# Patient Record
Sex: Male | Born: 1940 | Race: White | Hispanic: No | Marital: Married | Smoking: Never smoker
Health system: Southern US, Community
[De-identification: ages and names within clinical notes are randomized; demographics above are authoritative.]

## PROBLEM LIST (undated history)

## (undated) DIAGNOSIS — F0390 Unspecified dementia without behavioral disturbance: Secondary | ICD-10-CM

## (undated) DIAGNOSIS — E119 Type 2 diabetes mellitus without complications: Secondary | ICD-10-CM

## (undated) DIAGNOSIS — I2510 Atherosclerotic heart disease of native coronary artery without angina pectoris: Secondary | ICD-10-CM

## (undated) DIAGNOSIS — Q210 Ventricular septal defect: Secondary | ICD-10-CM

## (undated) DIAGNOSIS — E785 Hyperlipidemia, unspecified: Secondary | ICD-10-CM

## (undated) DIAGNOSIS — I4891 Unspecified atrial fibrillation: Secondary | ICD-10-CM

## (undated) DIAGNOSIS — I10 Essential (primary) hypertension: Secondary | ICD-10-CM

## (undated) DIAGNOSIS — Z5189 Encounter for other specified aftercare: Secondary | ICD-10-CM

## (undated) DIAGNOSIS — Z8613 Personal history of malaria: Secondary | ICD-10-CM

## (undated) DIAGNOSIS — K5730 Diverticulosis of large intestine without perforation or abscess without bleeding: Secondary | ICD-10-CM

## (undated) DIAGNOSIS — D649 Anemia, unspecified: Secondary | ICD-10-CM

## (undated) DIAGNOSIS — K760 Fatty (change of) liver, not elsewhere classified: Secondary | ICD-10-CM

## (undated) DIAGNOSIS — I714 Abdominal aortic aneurysm, without rupture, unspecified: Secondary | ICD-10-CM

## (undated) DIAGNOSIS — K219 Gastro-esophageal reflux disease without esophagitis: Secondary | ICD-10-CM

## (undated) HISTORY — DX: Anemia, unspecified: D64.9

## (undated) HISTORY — DX: Unspecified dementia, unspecified severity, without behavioral disturbance, psychotic disturbance, mood disturbance, and anxiety: F03.90

## (undated) HISTORY — DX: Personal history of malaria: Z86.13

## (undated) HISTORY — PX: VSD REPAIR: SHX276

## (undated) HISTORY — DX: Gastro-esophageal reflux disease without esophagitis: K21.9

## (undated) HISTORY — DX: Unspecified atrial fibrillation: I48.91

## (undated) HISTORY — DX: Hyperlipidemia, unspecified: E78.5

## (undated) HISTORY — DX: Encounter for other specified aftercare: Z51.89

## (undated) HISTORY — DX: Fatty (change of) liver, not elsewhere classified: K76.0

## (undated) HISTORY — PX: PTCA: SHX146

## (undated) HISTORY — DX: Ventricular septal defect: Q21.0

## (undated) HISTORY — DX: Atherosclerotic heart disease of native coronary artery without angina pectoris: I25.10

## (undated) HISTORY — DX: Diverticulosis of large intestine without perforation or abscess without bleeding: K57.30

## (undated) HISTORY — DX: Type 2 diabetes mellitus without complications: E11.9

## (undated) HISTORY — DX: Essential (primary) hypertension: I10

## (undated) HISTORY — DX: Abdominal aortic aneurysm, without rupture, unspecified: I71.40

---

## 1972-05-21 HISTORY — PX: VSD REPAIR: SHX276

## 1991-05-22 HISTORY — PX: CORONARY ARTERY BYPASS GRAFT: SHX141

## 2003-05-22 HISTORY — PX: ILIAC ARTERY STENT: SHX1786

## 2008-05-21 HISTORY — PX: CORONARY ANGIOGRAM: SHX5786

## 2011-05-22 HISTORY — PX: ATRIAL FIBRILLATION ABLATION: SHX5732

## 2011-05-22 HISTORY — PX: CORONARY ANGIOGRAM: SHX5786

## 2011-05-22 HISTORY — PX: PACEMAKER INSERTION: SHX728

## 2013-05-21 HISTORY — PX: PTCA: SHX146

## 2015-05-22 HISTORY — PX: LOWER EXTREMITY ANGIOGRAM: SHX5955

## 2018-01-10 LAB — LAB REPORT - SCANNED: EGFR: 73

## 2018-11-13 LAB — LAB REPORT - SCANNED: EGFR: 68

## 2021-08-28 LAB — CBC: RBC: 3.81 — AB (ref 3.87–5.11)

## 2021-08-28 LAB — HM HEPATITIS C SCREENING LAB: HM Hepatitis Screen: NEGATIVE

## 2021-08-28 LAB — TSH: TSH: 2.52 (ref 0.41–5.90)

## 2021-08-28 LAB — CBC AND DIFFERENTIAL
HCT: 35 — AB (ref 41–53)
Hemoglobin: 11.3 — AB (ref 13.5–17.5)
Neutrophils Absolute: 927
Platelets: 198 10*3/uL (ref 150–400)
WBC: 2.3

## 2021-08-28 LAB — BASIC METABOLIC PANEL WITH GFR
BUN: 10 (ref 4–21)
CO2: 25 — AB (ref 13–22)
Chloride: 107 (ref 99–108)
Creatinine: 0.8 (ref 0.6–1.3)
Glucose: 101
Potassium: 3.5 meq/L (ref 3.5–5.1)
Sodium: 142 (ref 137–147)

## 2021-08-28 LAB — COMPREHENSIVE METABOLIC PANEL WITH GFR
Albumin: 3.8 (ref 3.5–5.0)
Calcium: 8.9 (ref 8.7–10.7)
Globulin: 3.8
eGFR: 88

## 2021-08-28 LAB — HEPATIC FUNCTION PANEL
ALT: 10 U/L (ref 10–40)
AST: 25 (ref 14–40)
Alkaline Phosphatase: 66 (ref 25–125)
Bilirubin, Total: 0.6

## 2021-08-29 LAB — LIPID PANEL
Cholesterol: 159 (ref 0–200)
HDL: 51 (ref 35–70)
LDL Cholesterol: 86
Triglycerides: 123 (ref 40–160)

## 2021-09-28 ENCOUNTER — Emergency Department (HOSPITAL_COMMUNITY): Payer: Medicare HMO

## 2021-09-28 ENCOUNTER — Encounter (HOSPITAL_COMMUNITY): Payer: Self-pay | Admitting: Emergency Medicine

## 2021-09-28 ENCOUNTER — Emergency Department (HOSPITAL_COMMUNITY)
Admission: EM | Admit: 2021-09-28 | Discharge: 2021-09-28 | Disposition: A | Discharge status: Home / Self Care | Payer: Medicare HMO | Attending: Emergency Medicine | Admitting: Emergency Medicine

## 2021-09-28 ENCOUNTER — Other Ambulatory Visit: Payer: Self-pay

## 2021-09-28 DIAGNOSIS — Z7901 Long term (current) use of anticoagulants: Secondary | ICD-10-CM | POA: Diagnosis not present

## 2021-09-28 DIAGNOSIS — W1830XA Fall on same level, unspecified, initial encounter: Secondary | ICD-10-CM | POA: Diagnosis not present

## 2021-09-28 DIAGNOSIS — E876 Hypokalemia: Secondary | ICD-10-CM

## 2021-09-28 DIAGNOSIS — R1110 Vomiting, unspecified: Secondary | ICD-10-CM | POA: Insufficient documentation

## 2021-09-28 DIAGNOSIS — S51012A Laceration without foreign body of left elbow, initial encounter: Secondary | ICD-10-CM | POA: Insufficient documentation

## 2021-09-28 DIAGNOSIS — Y908 Blood alcohol level of 240 mg/100 ml or more: Secondary | ICD-10-CM | POA: Insufficient documentation

## 2021-09-28 DIAGNOSIS — W19XXXA Unspecified fall, initial encounter: Secondary | ICD-10-CM

## 2021-09-28 DIAGNOSIS — Z20822 Contact with and (suspected) exposure to covid-19: Secondary | ICD-10-CM | POA: Diagnosis not present

## 2021-09-28 DIAGNOSIS — Y92009 Unspecified place in unspecified non-institutional (private) residence as the place of occurrence of the external cause: Secondary | ICD-10-CM | POA: Insufficient documentation

## 2021-09-28 DIAGNOSIS — R4182 Altered mental status, unspecified: Secondary | ICD-10-CM | POA: Insufficient documentation

## 2021-09-28 DIAGNOSIS — R531 Weakness: Secondary | ICD-10-CM | POA: Insufficient documentation

## 2021-09-28 DIAGNOSIS — F1010 Alcohol abuse, uncomplicated: Secondary | ICD-10-CM

## 2021-09-28 DIAGNOSIS — S59902A Unspecified injury of left elbow, initial encounter: Secondary | ICD-10-CM | POA: Diagnosis present

## 2021-09-28 DIAGNOSIS — R600 Localized edema: Secondary | ICD-10-CM | POA: Diagnosis not present

## 2021-09-28 DIAGNOSIS — S51011A Laceration without foreign body of right elbow, initial encounter: Secondary | ICD-10-CM | POA: Diagnosis not present

## 2021-09-28 LAB — URINALYSIS, ROUTINE W REFLEX MICROSCOPIC
Bacteria, UA: NONE SEEN
Bilirubin Urine: NEGATIVE
Glucose, UA: NEGATIVE mg/dL
Ketones, ur: NEGATIVE mg/dL
Leukocytes,Ua: NEGATIVE
Nitrite: NEGATIVE
Protein, ur: NEGATIVE mg/dL
Specific Gravity, Urine: 1.009 (ref 1.005–1.030)
pH: 6 (ref 5.0–8.0)

## 2021-09-28 LAB — CBC WITH DIFFERENTIAL/PLATELET
Abs Immature Granulocytes: 0.18 10*3/uL — ABNORMAL HIGH (ref 0.00–0.07)
Basophils Absolute: 0 10*3/uL (ref 0.0–0.1)
Basophils Relative: 1 %
Eosinophils Absolute: 0 10*3/uL (ref 0.0–0.5)
Eosinophils Relative: 0 %
HCT: 32.6 % — ABNORMAL LOW (ref 39.0–52.0)
Hemoglobin: 10.7 g/dL — ABNORMAL LOW (ref 13.0–17.0)
Immature Granulocytes: 5 %
Lymphocytes Relative: 17 %
Lymphs Abs: 0.6 10*3/uL — ABNORMAL LOW (ref 0.7–4.0)
MCH: 30.4 pg (ref 26.0–34.0)
MCHC: 32.8 g/dL (ref 30.0–36.0)
MCV: 92.6 fL (ref 80.0–100.0)
Monocytes Absolute: 0.4 10*3/uL (ref 0.1–1.0)
Monocytes Relative: 12 %
Neutro Abs: 2.2 10*3/uL (ref 1.7–7.7)
Neutrophils Relative %: 65 %
Platelets: 219 10*3/uL (ref 150–400)
RBC: 3.52 MIL/uL — ABNORMAL LOW (ref 4.22–5.81)
RDW: 16.4 % — ABNORMAL HIGH (ref 11.5–15.5)
WBC: 3.4 10*3/uL — ABNORMAL LOW (ref 4.0–10.5)
nRBC: 0 % (ref 0.0–0.2)

## 2021-09-28 LAB — COMPREHENSIVE METABOLIC PANEL
ALT: 11 U/L (ref 0–44)
AST: 23 U/L (ref 15–41)
Albumin: 3.2 g/dL — ABNORMAL LOW (ref 3.5–5.0)
Alkaline Phosphatase: 68 U/L (ref 38–126)
Anion gap: 13 (ref 5–15)
BUN: 10 mg/dL (ref 8–23)
CO2: 21 mmol/L — ABNORMAL LOW (ref 22–32)
Calcium: 8.7 mg/dL — ABNORMAL LOW (ref 8.9–10.3)
Chloride: 103 mmol/L (ref 98–111)
Creatinine, Ser: 1.07 mg/dL (ref 0.61–1.24)
GFR, Estimated: >60 mL/min (ref >60)
Glucose, Bld: 136 mg/dL — ABNORMAL HIGH (ref 70–99)
Potassium: 3 mmol/L — ABNORMAL LOW (ref 3.5–5.1)
Sodium: 137 mmol/L (ref 135–145)
Total Bilirubin: 1 mg/dL (ref 0.3–1.2)
Total Protein: 7.4 g/dL (ref 6.5–8.1)

## 2021-09-28 LAB — I-STAT CHEM 8, ED
BUN: 9 mg/dL (ref 8–23)
Calcium, Ion: 1.01 mmol/L — ABNORMAL LOW (ref 1.15–1.40)
Chloride: 103 mmol/L (ref 98–111)
Creatinine, Ser: 1.3 mg/dL — ABNORMAL HIGH (ref 0.61–1.24)
Glucose, Bld: 134 mg/dL — ABNORMAL HIGH (ref 70–99)
HCT: 33 % — ABNORMAL LOW (ref 39.0–52.0)
Hemoglobin: 11.2 g/dL — ABNORMAL LOW (ref 13.0–17.0)
Potassium: 3.1 mmol/L — ABNORMAL LOW (ref 3.5–5.1)
Sodium: 139 mmol/L (ref 135–145)
TCO2: 20 mmol/L — ABNORMAL LOW (ref 22–32)

## 2021-09-28 LAB — TROPONIN I (HIGH SENSITIVITY)
Troponin I (High Sensitivity): 16 ng/L (ref <18)
Troponin I (High Sensitivity): 14 ng/L (ref <18)

## 2021-09-28 LAB — RESP PANEL BY RT-PCR (FLU A&B, COVID) ARPGX2
Influenza A by PCR: NEGATIVE
Influenza B by PCR: NEGATIVE
SARS Coronavirus 2 by RT PCR: NEGATIVE

## 2021-09-28 LAB — CBG MONITORING, ED: Glucose-Capillary: 135 mg/dL — ABNORMAL HIGH (ref 70–99)

## 2021-09-28 LAB — BRAIN NATRIURETIC PEPTIDE: B Natriuretic Peptide: 253 pg/mL — ABNORMAL HIGH (ref 0.0–100.0)

## 2021-09-28 LAB — ETHANOL: Alcohol, Ethyl (B): 286 mg/dL — ABNORMAL HIGH (ref <10)

## 2021-09-28 MED ORDER — PANTOPRAZOLE SODIUM 40 MG IV SOLR
40.0000 mg | Freq: Once | INTRAVENOUS | Status: AC
  Administered 2021-09-28: 40 mg via INTRAVENOUS
  Filled 2021-09-28: qty 10

## 2021-09-28 MED ORDER — ONDANSETRON HCL 4 MG/2ML IJ SOLN
INTRAMUSCULAR | Status: DC
Start: 2021-09-28 — End: 2021-09-28
  Filled 2021-09-28: qty 2

## 2021-09-28 MED ORDER — ONDANSETRON HCL 4 MG/2ML IJ SOLN
INTRAMUSCULAR | Status: AC
  Administered 2021-09-28: 4 mg
  Filled 2021-09-28: qty 2

## 2021-09-28 MED ORDER — ONDANSETRON HCL 4 MG/2ML IJ SOLN
4.0000 mg | Freq: Once | INTRAMUSCULAR | Status: AC
  Administered 2021-09-28: 4 mg via INTRAVENOUS

## 2021-09-28 MED ORDER — METOCLOPRAMIDE HCL 5 MG/ML IJ SOLN
10.0000 mg | Freq: Once | INTRAMUSCULAR | Status: AC
  Administered 2021-09-28: 10 mg via INTRAVENOUS
  Filled 2021-09-28: qty 2

## 2021-09-28 NOTE — ED Notes (Signed)
Pt stand by assist with no issues to use RR.  ?

## 2021-09-28 NOTE — ED Notes (Signed)
Patient vomiting x2, brown emesis with food particles.  Patient medicated per Dr Madilyn Hook. ?

## 2021-09-28 NOTE — Progress Notes (Signed)
Transition of Care [facility]) - Emergency Department Mini Assessment ? ? ?Patient Details  ?Name: Manuel Vargas ?MRN: [id number] ?Date of Birth: 1940/06/22 ? ?Transition of Care (TOC) CM/SW Contact:    ?Oletta Cohn, RN ?Phone Number: ?09/28/2021, 11:30 AM ? ? ?Clinical Narrative: ?Cherita Hebel J. Lucretia Roers, RN, BSN, [place] [phone] ?RNCM spoke with pt and daughter at bedside regarding discharge planning for Home Health Services. Offered pt medicare.gov list of home health agencies to choose from.  Pt chose [facility] to render services. Trena Platt of [facility] notified. Patient made aware that [facility] will be in contact in 24-48 hours.  No DME needs identified at this time. ? ? ?RNCM set up appointment with Sharlett Iles, MD on 3/15 @3 :40.  Spoke with pt at bedside and advised importance of keeping appointment.  Pt verbalizes understanding of keeping appointment. ? ? ? ? ?ED Mini Assessment: ?What brought you to the Emergency Department? : (P) level 2 trauma alert following fall on thinners ? ?Barriers to Discharge: (P) Barriers Resolved ? ?Barrier interventions: (P) follow-up appointment scheduled and home health services attained ? ?Means of departure: (P) Car ? ?Interventions which prevented an admission or readmission: Follow-up medical appointment, Home Health Consult or Services ? ? ? ?Patient Contact and Communications ?Key Contact 1: (P) , daughter ?  ?Spoke with: Urban Gibson) Elena ?Contact Date: (P) 09/28/21,   Contact time: (P) 1109 ?Contact Phone Number: (P) [phone] ?  ? ?Patient states their goals for this hospitalization and ongoing recovery are:: (P) Go home with my wife ?CMS Medicare.gov Compare Post Acute Care list provided to:: (P) Patient ?Choice offered to / list presented to : (P) Patient ? ?Admission diagnosis:  level 2 ?There are no problems to display for this patient. ? ?PCP:  Pcp, No ?Pharmacy:  No Pharmacies Listed ? ?

## 2021-09-28 NOTE — ED Triage Notes (Signed)
Patient from home, EMS stated that he has had two falls tonight, one at 1a and the other at 4a.  Patient was found on the floor.  Patient unable to recall the fall and if he had hit his head either time.  Patient was CAOx4 at 1am per EMS.  Patient is now unable to speak to any orientation questions.  Patient is on Xerelto.  Patient has right elbow bruising with large skin tear at the elbow, skin tear on left upper arm/bicept area.   ?

## 2021-09-28 NOTE — ED Notes (Signed)
Trauma Response Nurse Documentation ? ? ?Manuel Vargas is a 81 y.o. male arriving to [facility] ED via EMS ? ?On Xarelto (rivaroxaban) daily. Trauma was activated as a Level 2 by ED charge RN based on the following trauma criteria Elderly patients > 65 with head trauma on anti-coagulation (excluding ASA). Trauma team at the bedside on patient arrival. Patient cleared for CT by Dr. Ralene Bathe EDP. Patient to CT with team. GCS 14. ? ?History  ? History reviewed. No pertinent past medical history.  ? History reviewed. No pertinent surgical history.  ? ? ? ?Initial Focused Assessment (If applicable, or please see trauma documentation): ?Alert/confused male presents via EMS from home after falls x2 this morning at 1A and 4A, arrives in c-collar ?Airway patent/unobstructed, BS equal/clear ?No obvious uncontrolled hemorrhage, skin tears x3 to right elbow, skin tear x1 to left bicep ?PERRLA 31mm, GCS 14 alert to self but cannot report events of this evening, reported baseline of alertx4 ? ?CT's Completed:   ?CT Head and CT C-Spine  ? ?Interventions:  ?IV start and trauma lab draws ?Portable chest and pelvis XRAYS ?CTs as above ?COVID swab ?Wound care, dressing ?Zofran IV x2 ?[place] J collar for comfort ? ?Plan for disposition:  ?Pending results  ? ?Consults completed:  ?none at the time of this note. ? ?Event Summary: ?Patient arrives from home after falling x2, unknown details d/t confusion. On xarelto, unknown reasoning - EMS could not report medical hx. Vomiting after arrival and in CT scanner. Pending workup completion.  ? ?MTP Summary (If applicable): NA ? ?Bedside handoff with ED RN Gretta Cool RN.   ? ?[name]  ?Trauma Response RN ? ?Please call TRN at [phone] for further assistance. ?  ?

## 2021-09-28 NOTE — Discharge Instructions (Addendum)
Follow-up with home health and primary care.  His potassium level was slightly low and this should get rechecked by his primary care doctor.  Strongly recommend stopping alcohol use.  Discussed this with primary care doctor.  Would also recommend looking into resources in the community, please see attached resource guide for alcohol abuse. ?

## 2021-09-28 NOTE — ED Provider Notes (Signed)
?[facility] EM[facility]ENC[facility] ?Provider Note ? ? ?CSN: [id number] ?Arrival date & time: 09/28/21  0540 ? ?  ? ?History ? ?Chief Complaint  ?Patient presents with  ? Fall  ? ? ?Zadie CleverlyRobert Lecuyer is a 81 y.o. male. ? ?The history is provided by the patient and the EMS personnel.  ?Fall ?Zadie CleverlyRobert Lauture is a 81 y.o. male who presents to the Emergency Department complaining of fall.  He presents as a level 2 trauma alert following fall on thinners.  History is provided by EMS.  Per report patient had 2 falls at home.  1 fall was at 1 AM and the second was at 4 AM.  EMS was called out the first time and patient declined transport.  Patient on Xarelto, past medical history is unavailable.  Patient denies any complaints at time of ED arrival. ? ?  ? ?Home Medications ?Prior to Admission medications   ?Not on File  ?   ? ?Allergies    ?Patient has no known allergies.   ? ?Review of Systems   ?Review of Systems  ?All other systems reviewed and are negative. ? ?Physical Exam ?Updated Vital Signs ?BP (!) 135/56   Pulse 80   Temp (!) 97.5 ?F (36.4 ?C) (Oral)   Resp (!) 24   Ht 5\' 10"  (1.778 m)   Wt 68 kg   SpO2 99%   BMI 21.52 kg/m?  ?Physical Exam ?Vitals and nursing note reviewed.  ?Constitutional:   ?   Appearance: He is well-developed.  ?HENT:  ?   Head: Normocephalic and atraumatic.  ?Cardiovascular:  ?   Rate and Rhythm: Normal rate and regular rhythm.  ?   Heart sounds: No murmur heard. ?Pulmonary:  ?   Effort: Pulmonary effort is normal. No respiratory distress.  ?   Comments: Occasional rhonchi ?Abdominal:  ?   Palpations: Abdomen is soft.  ?   Tenderness: There is no abdominal tenderness. There is no guarding or rebound.  ?Musculoskeletal:     ?   General: No tenderness.  ?   Comments: Skin tears to bilateral elbows without significant tenderness.  2+ pitting edema to bilateral lower extremities with chronic skin changes to bilateral lower extremities  ?Skin: ?   General: Skin is warm and dry.   ?Neurological:  ?   Mental Status: He is alert.  ?   Comments: Oriented to person, hospital.  Disoriented to time and recent events.  4 out of 5 strength in all 4 extremities.  Very hard of hearing.  Confused.  Difficulty following commands.  ?Psychiatric:     ?   Behavior: Behavior normal.  ? ? ?ED Results / Procedures / Treatments   ?Labs ?(all labs ordered are listed, but only abnormal results are displayed) ?Labs Reviewed  ?COMPREHENSIVE METABOLIC PANEL - Abnormal; Notable for the following components:  ?    Result Value  ? Potassium 3.0 (*)   ? CO2 21 (*)   ? Glucose, Bld 136 (*)   ? Calcium 8.7 (*)   ? Albumin 3.2 (*)   ? All other components within normal limits  ?CBC WITH DIFFERENTIAL/PLATELET - Abnormal; Notable for the following components:  ? WBC 3.4 (*)   ? RBC 3.52 (*)   ? Hemoglobin 10.7 (*)   ? HCT 32.6 (*)   ? RDW 16.4 (*)   ? Lymphs Abs 0.6 (*)   ? Abs Immature Granulocytes 0.18 (*)   ? All other components within normal limits  ?  ETHANOL - Abnormal; Notable for the following components:  ? Alcohol, Ethyl (B) 286 (*)   ? All other components within normal limits  ?I-STAT CHEM 8, ED - Abnormal; Notable for the following components:  ? Potassium 3.1 (*)   ? Creatinine, Ser 1.30 (*)   ? Glucose, Bld 134 (*)   ? Calcium, Ion 1.01 (*)   ? TCO2 20 (*)   ? Hemoglobin 11.2 (*)   ? HCT 33.0 (*)   ? All other components within normal limits  ?CBG MONITORING, ED - Abnormal; Notable for the following components:  ? Glucose-Capillary 135 (*)   ? All other components within normal limits  ?RESP PANEL BY RT-PCR (FLU A&B, COVID) ARPGX2  ?URINALYSIS, ROUTINE W REFLEX MICROSCOPIC  ?BRAIN NATRIURETIC PEPTIDE  ?TROPONIN I (HIGH SENSITIVITY)  ?TROPONIN I (HIGH SENSITIVITY)  ? ? ?EKG ?EKG Interpretation ? 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 ?Radiology ?CT Head Wo Contrast ? ?Result Date: 09/28/2021 ?CLINICAL DATA:  Head trauma.  Level 2 fall on blood thinners. EXAM: CT HEAD WITHOUT CONTRAST CT CERVICAL SPINE WITHOUT CONTRAST TECHNIQUE: Multidetector CT imaging of the head and cervical spine was performed following the standard protocol without intravenous contrast. Multiplanar CT image reconstructions of the cervical spine were also generated. RADIATION DOSE REDUCTION: This exam was performed according to the departmental dose-optimization program which includes automated exposure control, adjustment of the mA and/or kV according to patient size and/or use of iterative reconstruction technique. COMPARISON:  None Available. FINDINGS: CT HEAD FINDINGS Brain: No evidence of acute infarction, hemorrhage, hydrocephalus, extra-axial collection or mass lesion/mass effect. Generalized brain atrophy. Vascular: No hyperdense vessel or unexpected calcification. Skull: Normal. Negative for fracture or focal lesion. Sinuses/Orbits: No acute finding.  Bilateral cataract resection CT CERVICAL SPINE FINDINGS Alignment: No traumatic malalignment. Degenerative appearing C3-4 anterolisthesis. Skull base and vertebrae: No acute fracture Soft tissues and spinal canal: No prevertebral fluid or swelling. No visible canal hematoma. Disc levels:  Generalized cervical spine degeneration. Upper chest: No acute finding.  Biapical emphysema. IMPRESSION: No evidence of intracranial or cervical spine injury. Electronically Signed   By: Tiburcio Pea M.D.   On: 09/28/2021 06:36  ? ?CT Cervical Spine Wo Contrast ? ?Result Date: 09/28/2021 ?CLINICAL DATA:  Head trauma.  Level 2 fall on blood thinners. EXAM: CT HEAD WITHOUT CONTRAST CT CERVICAL SPINE WITHOUT CONTRAST TECHNIQUE: Multidetector CT imaging of the head and cervical spine was performed following the standard  protocol without intravenous contrast. Multiplanar CT image reconstructions of the cervical spine were also generated. RADIATION DOSE REDUCTION: This exam was performed according to the departmental dose-optimization program which includes automated exposure control, adjustment of the mA and/or kV according to patient size and/or use of iterative reconstruction technique. COMPARISON:  None Available. FINDINGS: CT HEAD FINDINGS Brain: No evidence of acute infarction, hemorrhage, hydrocephalus, extra-axial collection or mass lesion/mass effect. Generalized brain atrophy. Vascular: No hyperdense vessel or unexpected calcification. Skull: Normal. Negative for fracture or focal lesion. Sinuses/Orbits: No acute finding.  Bilateral cataract resection CT CERVICAL SPINE FINDINGS Alignment: No traumatic malalignment. Degenerative appearing C3-4 anterolisthesis. Skull base and vertebrae: No acute fracture Soft tissues and spinal canal: No prevertebral fluid or swelling. No visible canal hematoma. Disc levels:  Generalized cervical spine degeneration. Upper chest: No acute finding.  Biapical emphysema. IMPRESSION: No evidence of intracranial or cervical spine injury. Electronically Signed   By: Tiburcio Pea M.D.   On: 09/28/2021 06:36  ? ?DG Pelvis Portable ? ?Result Date: 09/28/2021 ?CLINICAL DATA:  Status post fall. EXAM: PORTABLE PELVIS 1-2 VIEWS COMPARISON:  None FINDINGS: There is no evidence of pelvic fracture or diastasis. No pelvic bone lesions are seen. Lumbar degenerative disc disease. Vascular calcifications identified. Metallic vascular stent identified within the right groin. IMPRESSION: 1. No acute findings. 2. Lumbar degenerative disc disease. Electronically Signed   By: Signa Kell M.D.   On: 09/28/2021 06:09  ? ?DG Chest Port 1 View ? ?Result Date: 09/28/2021 ?CLINICAL DATA:  Status post level 2 trauma.  Fall.  Anticoagulated. EXAM: PORTABLE CHEST 1 VIEW COMPARISON:  None Available. FINDINGS: Left chest  wall pacer device noted with leads in the right atrial appendage and right ventricle. Previous median sternotomy and CABG procedure. Aortic atherosclerotic calcifications. Lung volumes are low. No pleural effusion. Chronic i

## 2021-09-28 NOTE — ED Notes (Signed)
Patient transported to CT with Trauma RN and EMT. ?

## 2021-09-28 NOTE — Progress Notes (Signed)
Orthopedic Tech Progress Note ?Patient Details:  ?Manuel Vargas ?[birth year] ?[id number] ? ?Patient ID: Manuel Vargas, male   DOB: 1941-03-23, 81 y.o.   MRN: [id number] ?Level II; not needed at the moment. ? ?[place] ?09/28/2021, 6:02 AM ? ?

## 2021-09-28 NOTE — ED Notes (Signed)
Patient returned from CT

## 2021-10-19 ENCOUNTER — Encounter (HOSPITAL_COMMUNITY): Payer: Self-pay

## 2021-10-19 ENCOUNTER — Ambulatory Visit (HOSPITAL_COMMUNITY)
Admission: EM | Admit: 2021-10-19 | Discharge: 2021-10-19 | Disposition: A | Discharge status: Home / Self Care | Payer: Medicare HMO | Attending: Emergency Medicine | Admitting: Emergency Medicine

## 2021-10-19 DIAGNOSIS — S81801A Unspecified open wound, right lower leg, initial encounter: Secondary | ICD-10-CM

## 2021-10-19 DIAGNOSIS — S81802A Unspecified open wound, left lower leg, initial encounter: Secondary | ICD-10-CM

## 2021-10-19 MED ORDER — CEPHALEXIN 500 MG PO CAPS: 500.0000 mg | ORAL_CAPSULE | Freq: Three times a day (TID) | ORAL | 0 refills | Status: AC

## 2021-10-19 NOTE — ED Triage Notes (Signed)
Pt reports itchy red rash on both legs x 10 days.

## 2021-10-19 NOTE — ED Provider Notes (Signed)
[facility]    CSN: [id number] Arrival date & time: 10/19/21  1537      History   Chief Complaint No chief complaint on file.   HPI Manuel Vargas is a 81 y.o. male.    Patient presents with a rash to the bilateral lower extremities for 10 days.  Mildly pruritic, denies pain.  Endorses that rash started around the ankles and has spread up.  Denies changes in soaps, lotions detergents fragrances, dietary changes, recent travel or changes in medications.  Has attempted use of Eucerin cream which has been ineffective.  Taking Xarelto daily.  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  Poor historian for medical history.     History reviewed. No pertinent past medical history.  There are no problems to display for this patient.   History reviewed. No pertinent surgical history.     Home Medications    Prior to Admission medications   Medication Sig Start Date End Date Taking? Authorizing Provider  cephALEXin (KEFLEX) 500 MG capsule Take 1 capsule (500 mg total) by mouth 3 (three) times daily for 5 days. 10/19/21 10/24/21 Yes Valinda Hoar, NP    Family History History reviewed. No pertinent family history.  Social History Social History   Tobacco Use   Smoking status: Never   Smokeless tobacco: Never     Allergies   Patient has no known allergies.   Review of Systems Review of Systems  Constitutional: Negative.   Respiratory: Negative.    Skin:  Positive for rash and wound. Negative for color change and pallor.  Neurological: Negative.     Physical Exam Triage Vital Signs ED Triage Vitals  Enc Vitals Group     BP 10/19/21 1642 (!) 141/75     Pulse Rate 10/19/21 1651 82     Resp 10/19/21 1641 18     Temp 10/19/21 1641 98.3 F (36.8 C)     Temp Source 10/19/21 1641 Oral     SpO2 10/19/21 1641 100 %     Weight --      Height --      Head Circumference --      Peak Flow --      Pain Score --      Pain Loc --      Pain Edu?  --      Excl. in GC? --    No data found.  Updated Vital Signs BP (!) 141/75   Pulse 82   Temp 98.3 F (36.8 C) (Oral)   Resp 16   SpO2 100%   Visual Acuity Right Eye Distance:   Left Eye Distance:   Bilateral Distance:    Right Eye Near:   Left Eye Near:    Bilateral Near:     Physical Exam Constitutional:      Appearance: Normal appearance.  Eyes:     Extraocular Movements: Extraocular movements intact.  Pulmonary:     Effort: Pulmonary effort is normal.  Skin:    Comments: Defer to photo below   Neurological:     Mental Status: He is alert and oriented to person, place, and time. Mental status is at baseline.  Psychiatric:        Mood and Affect: Mood normal.        Behavior: Behavior normal.      UC Treatments / Results  Labs (all labs ordered are listed, but only abnormal results are displayed) Labs Reviewed -  No data to display  EKG   Radiology No results found.  Procedures Procedures (including critical care time)  Medications Ordered in UC Medications - No data to display  Initial Impression / Assessment and Plan / UC Course  I have reviewed the triage vital signs and the nursing notes.  Pertinent labs & imaging results that were available during my care of the patient were reviewed by me and considered in my medical decision making (see chart for details).  Open wound of right lower leg, initial encounter Open wound of left lower leg, initial encounter  Care today completed by this provider and Dr. Leonides Grills, etiology of symptoms possibly related to poor vasculature, discussed with patient, Keflex 3 times daily for 5 daily as prescribed for prophylactic care as there are open wounds to the leg due to excoriations, recommended twice daily use of Aquaphor to moisturize skin, may use throughout the day as needed, wound care referral placed for further management of symptoms Final Clinical Impressions(s) / UC Diagnoses   Final diagnoses:  None    Discharge Instructions   None    ED Prescriptions     Medication Sig Dispense Auth. Provider   cephALEXin (KEFLEX) 500 MG capsule Take 1 capsule (500 mg total) by mouth 3 (three) times daily for 5 days. 15 capsule Fynn Vanblarcom, Elita Boone, NP      PDMP not reviewed this encounter.   Valinda Hoar, [place] 10/23/21 [phone]

## 2021-10-19 NOTE — Discharge Instructions (Addendum)
Begin use of Keflex 3 times daily for the next 5 days to cover for infection  Purchase Aquaphor ointment which can be found over-the-counter at most pharmacies and store such as [employer], apply generously twice daily to keep area moisturized, if skin appears to be dry throughout the day, may reapply as needed  A wound care referral has been made for you, someone will reach out to schedule you an appointment for evaluation, if you have not heard from the wound care center by October 26, 2021 you may call the number below to schedule your appoint

## 2022-01-15 ENCOUNTER — Emergency Department (HOSPITAL_COMMUNITY): Payer: Medicare HMO

## 2022-01-15 ENCOUNTER — Emergency Department (HOSPITAL_COMMUNITY)
Admission: EM | Admit: 2022-01-15 | Discharge: 2022-01-15 | Disposition: A | Discharge status: Home / Self Care | Payer: Medicare HMO | Attending: Emergency Medicine | Admitting: Emergency Medicine

## 2022-01-15 DIAGNOSIS — I6782 Cerebral ischemia: Secondary | ICD-10-CM | POA: Diagnosis not present

## 2022-01-15 DIAGNOSIS — S0081XA Abrasion of other part of head, initial encounter: Secondary | ICD-10-CM | POA: Diagnosis not present

## 2022-01-15 DIAGNOSIS — W19XXXA Unspecified fall, initial encounter: Secondary | ICD-10-CM | POA: Diagnosis not present

## 2022-01-15 DIAGNOSIS — Y92009 Unspecified place in unspecified non-institutional (private) residence as the place of occurrence of the external cause: Secondary | ICD-10-CM | POA: Diagnosis not present

## 2022-01-15 DIAGNOSIS — M47812 Spondylosis without myelopathy or radiculopathy, cervical region: Secondary | ICD-10-CM | POA: Diagnosis not present

## 2022-01-15 DIAGNOSIS — T148XXA Other injury of unspecified body region, initial encounter: Secondary | ICD-10-CM

## 2022-01-15 DIAGNOSIS — Y907 Blood alcohol level of 200-239 mg/100 ml: Secondary | ICD-10-CM | POA: Insufficient documentation

## 2022-01-15 DIAGNOSIS — M47816 Spondylosis without myelopathy or radiculopathy, lumbar region: Secondary | ICD-10-CM | POA: Insufficient documentation

## 2022-01-15 DIAGNOSIS — S0990XA Unspecified injury of head, initial encounter: Secondary | ICD-10-CM | POA: Diagnosis present

## 2022-01-15 LAB — COMPREHENSIVE METABOLIC PANEL
ALT: 11 U/L (ref 0–44)
AST: 22 U/L (ref 15–41)
Albumin: 3.2 g/dL — ABNORMAL LOW (ref 3.5–5.0)
Alkaline Phosphatase: 75 U/L (ref 38–126)
Anion gap: 11 (ref 5–15)
BUN: 7 mg/dL — ABNORMAL LOW (ref 8–23)
CO2: 21 mmol/L — ABNORMAL LOW (ref 22–32)
Calcium: 8.6 mg/dL — ABNORMAL LOW (ref 8.9–10.3)
Chloride: 107 mmol/L (ref 98–111)
Creatinine, Ser: 0.95 mg/dL (ref 0.61–1.24)
GFR, Estimated: >60 mL/min (ref >60)
Glucose, Bld: 105 mg/dL — ABNORMAL HIGH (ref 70–99)
Potassium: 3.3 mmol/L — ABNORMAL LOW (ref 3.5–5.1)
Sodium: 139 mmol/L (ref 135–145)
Total Bilirubin: 0.6 mg/dL (ref 0.3–1.2)
Total Protein: 7.1 g/dL (ref 6.5–8.1)

## 2022-01-15 LAB — CBC WITH DIFFERENTIAL/PLATELET
Abs Immature Granulocytes: 0.18 10*3/uL — ABNORMAL HIGH (ref 0.00–0.07)
Basophils Absolute: 0 10*3/uL (ref 0.0–0.1)
Basophils Relative: 1 %
Eosinophils Absolute: 0 10*3/uL (ref 0.0–0.5)
Eosinophils Relative: 0 %
HCT: 35.7 % — ABNORMAL LOW (ref 39.0–52.0)
Hemoglobin: 11.5 g/dL — ABNORMAL LOW (ref 13.0–17.0)
Immature Granulocytes: 7 %
Lymphocytes Relative: 42 %
Lymphs Abs: 1.2 10*3/uL (ref 0.7–4.0)
MCH: 29.6 pg (ref 26.0–34.0)
MCHC: 32.2 g/dL (ref 30.0–36.0)
MCV: 92 fL (ref 80.0–100.0)
Monocytes Absolute: 0.6 10*3/uL (ref 0.1–1.0)
Monocytes Relative: 23 %
Neutro Abs: 0.7 10*3/uL — ABNORMAL LOW (ref 1.7–7.7)
Neutrophils Relative %: 27 %
Platelets: 179 10*3/uL (ref 150–400)
RBC: 3.88 MIL/uL — ABNORMAL LOW (ref 4.22–5.81)
RDW: 16.8 % — ABNORMAL HIGH (ref 11.5–15.5)
WBC: 2.8 10*3/uL — ABNORMAL LOW (ref 4.0–10.5)
nRBC: 0 % (ref 0.0–0.2)

## 2022-01-15 LAB — URINALYSIS, ROUTINE W REFLEX MICROSCOPIC
Bilirubin Urine: NEGATIVE
Glucose, UA: NEGATIVE mg/dL
Hgb urine dipstick: NEGATIVE
Ketones, ur: NEGATIVE mg/dL
Leukocytes,Ua: NEGATIVE
Nitrite: NEGATIVE
Protein, ur: NEGATIVE mg/dL
Specific Gravity, Urine: 1.003 — ABNORMAL LOW (ref 1.005–1.030)
pH: 6 (ref 5.0–8.0)

## 2022-01-15 LAB — I-STAT CHEM 8, ED
BUN: 7 mg/dL — ABNORMAL LOW (ref 8–23)
Calcium, Ion: 1.07 mmol/L — ABNORMAL LOW (ref 1.15–1.40)
Chloride: 106 mmol/L (ref 98–111)
Creatinine, Ser: 1.2 mg/dL (ref 0.61–1.24)
Glucose, Bld: 103 mg/dL — ABNORMAL HIGH (ref 70–99)
HCT: 38 % — ABNORMAL LOW (ref 39.0–52.0)
Hemoglobin: 12.9 g/dL — ABNORMAL LOW (ref 13.0–17.0)
Potassium: 3.3 mmol/L — ABNORMAL LOW (ref 3.5–5.1)
Sodium: 142 mmol/L (ref 135–145)
TCO2: 21 mmol/L — ABNORMAL LOW (ref 22–32)

## 2022-01-15 LAB — ETHANOL: Alcohol, Ethyl (B): 227 mg/dL — ABNORMAL HIGH (ref <10)

## 2022-01-15 LAB — PROTIME-INR
INR: 1.1 (ref 0.8–1.2)
Prothrombin Time: 14 seconds (ref 11.4–15.2)

## 2022-01-15 MED ORDER — ONDANSETRON 4 MG PO TBDP: 4.0000 mg | ORAL_TABLET | Freq: Once | ORAL | Status: DC

## 2022-01-15 NOTE — ED Provider Notes (Signed)
Patient arrives after a fall at home.  Is work-up was negative but he is intoxicated. Needs time of metabolize and reassess. Physical Exam  BP (!) 141/78 (BP Location: Right Arm)   Pulse 98   Temp 98.1 F (36.7 C) (Oral)   Resp 16   SpO2 97%   Physical Exam Vitals and nursing note reviewed.  Constitutional:      General: He is not in acute distress.    Appearance: Normal appearance. He is well-developed. He is not ill-appearing, toxic-appearing or diaphoretic.  HENT:     Head: Normocephalic and atraumatic.     Right Ear: External ear normal.     Left Ear: External ear normal.     Nose: Nose normal.     Mouth/Throat:     Mouth: Mucous membranes are moist.     Pharynx: Oropharynx is clear.  Cardiovascular:     Rate and Rhythm: Normal rate and regular rhythm.  Pulmonary:     Effort: Pulmonary effort is normal. No respiratory distress.  Abdominal:     General: There is no distension.     Palpations: Abdomen is soft.  Musculoskeletal:        General: No swelling or deformity.     Cervical back: Normal range of motion and neck supple.  Skin:    General: Skin is warm and dry.  Neurological:     Mental Status: He is alert.     Procedures  Procedures  ED Course / MDM    Medical Decision Making Amount and/or Complexity of Data Reviewed Labs: ordered. Radiology: ordered.  Risk Prescription drug management.   On assessment, patient sleeping but arousable.  X-ray imaging showed concern of possible left clavicle injury.  Patient has no point tenderness to this area.  I suspect this is a chronic finding.  Patient's daughter-in-law came to the ED and does feel comfortable taking him home to further metabolize his alcohol.  Patient's daughter-in-law states that he will drink alcohol but then, due to his chronic short-term memory loss, will forget that he drank anything in drink more.  This has caused issues with patient inadvertently becoming intoxicated.  Patient is stable for  discharge at this time.       Gloris Manchester, MD 01/15/22 1038

## 2022-01-15 NOTE — ED Triage Notes (Addendum)
Patient brought in by [facility] from home. Patient fell while at home tonight, striking his head. Patient arrives with an abrasion to right side of his forehead, right shoulder, and left arm. Patient GCS 14, oriented to place only. Vital signs stable. Patient endorses currently taking xarelto.

## 2022-01-15 NOTE — Discharge Instructions (Addendum)
Avoid excessive alcohol use.  Return to emergency department at any time for any new or worsening symptoms of concern.

## 2022-01-15 NOTE — ED Provider Notes (Signed)
[facility] EMERGENCY DEPARTMENT Provider Note   CSN: [id number] Arrival date & time: 01/15/22  0258     History  Chief Complaint  Patient presents with   Manuel Vargas is a 81 y.o. male.  Per EMS, fell at home. Abrasions to forehead and arms. No lacerations. Unsure how he fell.    Fall       Home Medications Prior to Admission medications   Not on File      Allergies    Patient has no known allergies.    Review of Systems   Review of Systems  Physical Exam Updated Vital Signs BP (!) 141/78 (BP Location: Right Arm)   Pulse 98   Temp 98.1 F (36.7 C) (Oral)   Resp 16   SpO2 97%  Physical Exam Vitals and nursing note reviewed.  Constitutional:      Appearance: He is well-developed.     Comments: Smells strongly of alcohol  HENT:     Head: Normocephalic and atraumatic.     Mouth/Throat:     Mouth: Mucous membranes are moist.     Pharynx: Oropharynx is clear.  Eyes:     Pupils: Pupils are equal, round, and reactive to light.  Cardiovascular:     Rate and Rhythm: Normal rate.  Pulmonary:     Effort: Pulmonary effort is normal. No respiratory distress.  Abdominal:     General: There is no distension.  Musculoskeletal:        General: Normal range of motion.     Cervical back: Normal range of motion.  Skin:    General: Skin is warm and dry.     Comments: Abrasion to right forehead Abrasion to bilateral forearms  Neurological:     Mental Status: He is alert.     Comments: Oriented to self, place of birth and situation. Does not know year, month.      ED Results / Procedures / Treatments   Labs (all labs ordered are listed, but only abnormal results are displayed) Labs Reviewed  CBC WITH DIFFERENTIAL/PLATELET - Abnormal; Notable for the following components:      Result Value   WBC 2.8 (*)    RBC 3.88 (*)    Hemoglobin 11.5 (*)    HCT 35.7 (*)    RDW 16.8 (*)    Neutro Abs 0.7 (*)    Abs Immature Granulocytes 0.18  (*)    All other components within normal limits  COMPREHENSIVE METABOLIC PANEL - Abnormal; Notable for the following components:   Potassium 3.3 (*)    CO2 21 (*)    Glucose, Bld 105 (*)    BUN 7 (*)    Calcium 8.6 (*)    Albumin 3.2 (*)    All other components within normal limits  URINALYSIS, ROUTINE W REFLEX MICROSCOPIC - Abnormal; Notable for the following components:   Color, Urine STRAW (*)    Specific Gravity, Urine 1.003 (*)    All other components within normal limits  ETHANOL - Abnormal; Notable for the following components:   Alcohol, Ethyl (B) 227 (*)    All other components within normal limits  I-STAT CHEM 8, ED - Abnormal; Notable for the following components:   Potassium 3.3 (*)    BUN 7 (*)    Glucose, Bld 103 (*)    Calcium, Ion 1.07 (*)    TCO2 21 (*)    Hemoglobin 12.9 (*)    HCT 38.0 (*)  All other components within normal limits  PROTIME-INR    EKG None  Radiology DG Shoulder Right  Result Date: 01/15/2022 CLINICAL DATA:  Status post fall. EXAM: RIGHT SHOULDER - 2+ VIEW COMPARISON:  None Available. FINDINGS: A cortical irregularity of indeterminate age is seen involving the distal right clavicle. There is no evidence of dislocation. Mild degenerative changes are seen involving the right acromioclavicular joint and right glenohumeral articulation. Soft tissues are unremarkable. IMPRESSION: Cortical irregularity involving the distal right clavicle, which may represent a nondisplaced fracture of indeterminate age. Correlation with physical examination is recommended to determine the presence of point tenderness. Electronically Signed   By: Aram Candela M.D.   On: 01/15/2022 03:50   DG Chest 1 View  Result Date: 01/15/2022 CLINICAL DATA:  Status post fall. EXAM: CHEST  1 VIEW COMPARISON:  Sep 28, 2021 FINDINGS: There is a multi lead AICD. Multiple sternal wires and vascular clips are seen. The cardiac silhouette is mildly enlarged and unchanged in  size. There is marked severity calcification of the aortic arch. Mild, diffuse, chronic appearing increased interstitial lung markings are noted. Mild atelectasis is seen within the bilateral lung bases. There is no evidence of a pleural effusion or pneumothorax. Multilevel degenerative changes are seen throughout the thoracic spine. IMPRESSION: 1. Evidence of prior median sternotomy/CABG. 2. Chronic appearing increased interstitial lung markings with mild bibasilar atelectasis. Electronically Signed   By: Aram Candela M.D.   On: 01/15/2022 03:47   DG Pelvis 1-2 Views  Result Date: 01/15/2022 CLINICAL DATA:  Status post fall. EXAM: PELVIS - 1-2 VIEW COMPARISON:  Sep 28, 2021 FINDINGS: There is no evidence of pelvic fracture or diastasis. No pelvic bone lesions are seen. Degenerative changes are seen within the visualized portion of the lower lumbar spine. Radiopaque vascular stents are seen overlying the lower lumbar spine and along the medial aspect of the proximal right femur. IMPRESSION: No acute osseous abnormality. Electronically Signed   By: Aram Candela M.D.   On: 01/15/2022 03:45   DG Shoulder Left  Result Date: 01/15/2022 CLINICAL DATA:  Status post fall. EXAM: LEFT SHOULDER - 2+ VIEW COMPARISON:  None Available. FINDINGS: There is no evidence of an fracture or dislocation. Degenerative changes seen involving the left acromioclavicular joint and left glenohumeral articulation. An overlying multi lead AICD is noted. Soft tissues are unremarkable. IMPRESSION: No acute osseous abnormality. Electronically Signed   By: Aram Candela M.D.   On: 01/15/2022 03:43   CT Head Wo Contrast  Result Date: 01/15/2022 CLINICAL DATA:  Recent fall with headaches and neck pain, initial encounter EXAM: CT HEAD WITHOUT CONTRAST CT CERVICAL SPINE WITHOUT CONTRAST TECHNIQUE: Multidetector CT imaging of the head and cervical spine was performed following the standard protocol without intravenous contrast.  Multiplanar CT image reconstructions of the cervical spine were also generated. RADIATION DOSE REDUCTION: This exam was performed according to the departmental dose-optimization program which includes automated exposure control, adjustment of the mA and/or kV according to patient size and/or use of iterative reconstruction technique. COMPARISON:  09/28/2021 FINDINGS: CT HEAD FINDINGS Brain: No evidence of acute infarction, hemorrhage, hydrocephalus, extra-axial collection or mass lesion/mass effect. Chronic atrophic and ischemic changes are noted and stable. Vascular: No hyperdense vessel or unexpected calcification. Skull: Normal. Negative for fracture or focal lesion. Sinuses/Orbits: No acute finding. Other: None. CT CERVICAL SPINE FINDINGS Alignment: Within normal limits. Skull base and vertebrae: 7 cervical segments are well visualized. Vertebral body height is well maintained. Multilevel osteophytic changes and facet hypertrophic  changes are seen. No acute fracture or acute facet abnormality is noted. The odontoid is within normal limits. Soft tissues and spinal canal: Vascular calcifications are seen. No focal hematoma or adenopathy is noted. 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 Other: None IMPRESSION: CT of the head: Chronic atrophic and ischemic changes without acute abnormality. CT of the cervical spine: Multilevel degenerative change without acute abnormality. Electronically Signed   By: Alcide Clever M.D.   On: 01/15/2022 03:35   CT Cervical Spine Wo Contrast  Result Date: 01/15/2022 CLINICAL DATA:  Recent fall with headaches and neck pain, initial encounter EXAM: CT HEAD WITHOUT CONTRAST CT CERVICAL SPINE WITHOUT CONTRAST TECHNIQUE: Multidetector CT imaging of the head and cervical spine was performed following the standard protocol without intravenous contrast. Multiplanar CT image reconstructions of the cervical spine were also generated. RADIATION DOSE REDUCTION: This  exam was performed according to the departmental dose-optimization program which includes automated exposure control, adjustment of the mA and/or kV according to patient size and/or use of iterative reconstruction technique. COMPARISON:  09/28/2021 FINDINGS: CT HEAD FINDINGS Brain: No evidence of acute infarction, hemorrhage, hydrocephalus, extra-axial collection or mass lesion/mass effect. Chronic atrophic and ischemic changes are noted and stable. Vascular: No hyperdense vessel or unexpected calcification. Skull: Normal. Negative for fracture or focal lesion. Sinuses/Orbits: No acute finding. Other: None. CT CERVICAL SPINE FINDINGS Alignment: Within normal limits. Skull base and vertebrae: 7 cervical segments are well visualized. Vertebral body height is well maintained. Multilevel osteophytic changes and facet hypertrophic changes are seen. No acute fracture or acute facet abnormality is noted. The odontoid is within normal limits. Soft tissues and spinal canal: Vascular calcifications are seen. No focal hematoma or adenopathy is noted. 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 Other: None IMPRESSION: CT of the head: Chronic atrophic and ischemic changes without acute abnormality. CT of the cervical spine: Multilevel degenerative change without acute abnormality. Electronically Signed   By: Alcide Clever M.D.   On: 01/15/2022 03:35    Procedures Procedures    Medications Ordered in ED Medications  ondansetron (ZOFRAN-ODT) disintegrating tablet 4 mg (4 mg Oral Not Given 01/15/22 0515)    ED Course/ Medical Decision Making/ A&P                           Medical Decision Making Amount and/or Complexity of Data Reviewed Labs: ordered. Radiology: ordered.  Risk Prescription drug management.   No wounds requiring repair.  Xr's without bony injuries or head injury.  ETOH >200. Talked with wife about picking patient up. She is upset that we took him when she said no. I tried  to explain that I was not the one at her house that came to 'take him'. However, she was adamant it was my fault and that 'he is your problem until I can find a way to come get him'. She doesn't have a license or a car unfortunately. At this time, will allow patient to sober up and then figure out a way to get him home.   Final Clinical Impression(s) / ED Diagnoses Final diagnoses:  None    Rx / DC Orders ED Discharge Orders     None         Jhase Creppel, Barbara Cower, MD 01/15/22 [phone]

## 2022-01-15 NOTE — ED Notes (Signed)
Trauma Response Nurse Documentation   Ameir Faria is a 81 y.o. male arriving to [facility] ED via EMS  On Xarelto (rivaroxaban) daily. Trauma was activated as a Level 2 by Alric Seton based on the following trauma criteria Elderly patients > 65 with head trauma on anti-coagulation (excluding ASA). Trauma team at the bedside on patient arrival.   Patient cleared for CT by Dr. Clayborne Dana. Pt transported to CT with trauma response nurse present to monitor. RN remained with the patient throughout their absence from the department for clinical observation.   GCS 14.  History   No past medical history on file.   No past surgical history on file.     Initial Focused Assessment (If applicable, or please see trauma documentation): Airway: intact Breathing: spontaneous and unlabored Circulation: bleeding controlled (abrasion right forehead, right shoulder, left arm).  CT's Completed:   CT Head and CT C-Spine   Interventions:  Trauma labs CT head CT c-spine DG chest DG pelvis DG left and right shoulder  Plan for disposition:  Unknown at this time.  Consults completed:  none at 0346.  Event Summary: Patient brought in by [facility] EMS from home, patient was walking around at home, and fell striking his head. Patient presents with abrasions to his right forehead and right shoulder, and left arm. Patient confused, oriented to place only. Vital signs stable. Patient to CT and Xray with TRN.   Bedside handoff with ED RN Clara.    Leota Sauers  Trauma Response RN  Please call TRN at [phone] for further assistance.

## 2022-01-15 NOTE — Progress Notes (Signed)
   01/15/22 0300  Clinical Encounter Type  Visited With Patient not available  Visit Type Initial;Trauma  Referral From Nurse  Consult/Referral To Chaplain   Chaplain responded to a level two trauma. Patient was under the care of the medical team.  No family was present.  If a chaplain is requested someone will respond.   Valerie Roys [facility]  [phone]

## 2022-03-28 ENCOUNTER — Telehealth: Payer: Self-pay

## 2022-03-28 NOTE — Telephone Encounter (Signed)
Patient's daughter Jennings Books) called to establish care for her parent with Dr. Drue Novel. Is this ok?   Jennings Books MRN: [id number]

## 2022-03-28 NOTE — Telephone Encounter (Signed)
That is okay.

## 2022-04-09 DIAGNOSIS — I2510 Atherosclerotic heart disease of native coronary artery without angina pectoris: Secondary | ICD-10-CM | POA: Insufficient documentation

## 2022-04-09 DIAGNOSIS — D529 Folate deficiency anemia, unspecified: Secondary | ICD-10-CM | POA: Insufficient documentation

## 2022-04-09 DIAGNOSIS — D520 Dietary folate deficiency anemia: Secondary | ICD-10-CM | POA: Insufficient documentation

## 2022-04-09 DIAGNOSIS — E538 Deficiency of other specified B group vitamins: Secondary | ICD-10-CM | POA: Insufficient documentation

## 2022-04-10 ENCOUNTER — Ambulatory Visit: Payer: Medicare HMO | Admitting: Internal Medicine

## 2022-08-04 ENCOUNTER — Other Ambulatory Visit: Payer: Self-pay

## 2022-08-04 ENCOUNTER — Emergency Department (HOSPITAL_COMMUNITY): Payer: Medicare Other

## 2022-08-04 ENCOUNTER — Emergency Department (HOSPITAL_COMMUNITY)
Admission: EM | Admit: 2022-08-04 | Discharge: 2022-08-04 | Disposition: A | Discharge status: Home / Self Care | Payer: Medicare Other | Attending: Emergency Medicine | Admitting: Emergency Medicine

## 2022-08-04 DIAGNOSIS — R079 Chest pain, unspecified: Secondary | ICD-10-CM | POA: Insufficient documentation

## 2022-08-04 LAB — CBC
HCT: 34.1 % — ABNORMAL LOW (ref 39.0–52.0)
Hemoglobin: 10.9 g/dL — ABNORMAL LOW (ref 13.0–17.0)
MCH: 29.7 pg (ref 26.0–34.0)
MCHC: 32 g/dL (ref 30.0–36.0)
MCV: 92.9 fL (ref 80.0–100.0)
Platelets: 212 10*3/uL (ref 150–400)
RBC: 3.67 MIL/uL — ABNORMAL LOW (ref 4.22–5.81)
RDW: 14.7 % (ref 11.5–15.5)
WBC: 3 10*3/uL — ABNORMAL LOW (ref 4.0–10.5)
nRBC: 0 % (ref 0.0–0.2)

## 2022-08-04 LAB — BASIC METABOLIC PANEL
Anion gap: 11 (ref 5–15)
BUN: 7 mg/dL — ABNORMAL LOW (ref 8–23)
CO2: 21 mmol/L — ABNORMAL LOW (ref 22–32)
Calcium: 8.5 mg/dL — ABNORMAL LOW (ref 8.9–10.3)
Chloride: 103 mmol/L (ref 98–111)
Creatinine, Ser: 0.77 mg/dL (ref 0.61–1.24)
GFR, Estimated: >60 mL/min (ref >60)
Glucose, Bld: 120 mg/dL — ABNORMAL HIGH (ref 70–99)
Potassium: 3.1 mmol/L — ABNORMAL LOW (ref 3.5–5.1)
Sodium: 135 mmol/L (ref 135–145)

## 2022-08-04 LAB — TROPONIN I (HIGH SENSITIVITY)
Troponin I (High Sensitivity): 21 ng/L — ABNORMAL HIGH (ref <18)
Troponin I (High Sensitivity): 20 ng/L — ABNORMAL HIGH (ref <18)

## 2022-08-04 LAB — CBG MONITORING, ED: Glucose-Capillary: 120 mg/dL — ABNORMAL HIGH (ref 70–99)

## 2022-08-04 MED ORDER — ASPIRIN 81 MG PO CHEW
324.0000 mg | CHEWABLE_TABLET | Freq: Once | ORAL | Status: AC
  Administered 2022-08-04: 324 mg via ORAL
  Filled 2022-08-04: qty 4

## 2022-08-04 NOTE — ED Provider Notes (Signed)
[place] Provider Note   CSN: [id number] Arrival date & time: 08/04/22  0645     History  Chief Complaint  Patient presents with   Chest Pain    Patient to ED via EMS with complaint of chest pain this 0600 after waking up.    Manuel Vargas is a 82 y.o. male.  82 yo M with a chief complaint of chest pain.  At least this was reported, the patient actually does not remember what happened.  Reportedly has a history of dementia.  He tells me he feels fine.  Has been fine the past couple days.  He has a chronic cough that is unchanged.  Denies chest pain difficulty breathing abdominal pain nausea vomiting or diarrhea.  Feels like he is been eating and drinking normally.  He does not think he had a heart attack before.  Does not think he has high blood pressure or high cholesterol.  Does not think that he has diabetes.  He is not sure if mom dad brothers or sisters around heart attack before.  He is a remote smoker.  Smoked for 30 years.   Chest Pain      Home Medications Prior to Admission medications   Not on File      Allergies    Patient has no known allergies.    Review of Systems   Review of Systems  Cardiovascular:  Positive for chest pain.    Physical Exam Updated Vital Signs BP (!) 160/91   Pulse 80   Resp 13   Ht 5\' 10"  (1.778 m)   Wt 72.6 kg   SpO2 100%   BMI 22.96 kg/m  Physical Exam Vitals and nursing note reviewed.  Constitutional:      Appearance: He is well-developed.  HENT:     Head: Normocephalic and atraumatic.  Eyes:     Pupils: Pupils are equal, round, and reactive to light.  Neck:     Vascular: No JVD.  Cardiovascular:     Rate and Rhythm: Normal rate and regular rhythm.     Heart sounds: No murmur heard.    No friction rub. No gallop.  Pulmonary:     Effort: No respiratory distress.     Breath sounds: No wheezing.  Abdominal:     General: There is no distension.     Tenderness: There  is no abdominal tenderness. There is no guarding or rebound.  Musculoskeletal:        General: Normal range of motion.     Cervical back: Normal range of motion and neck supple.  Skin:    Coloration: Skin is not pale.     Findings: No rash.  Neurological:     Mental Status: He is alert and oriented to person, place, and time.  Psychiatric:        Behavior: Behavior normal.     ED Results / Procedures / Treatments   Labs (all labs ordered are listed, but only abnormal results are displayed) Labs Reviewed  BASIC METABOLIC PANEL - Abnormal; Notable for the following components:      Result Value   Potassium 3.1 (*)    CO2 21 (*)    Glucose, Bld 120 (*)    BUN 7 (*)    Calcium 8.5 (*)    All other components within normal limits  CBC - Abnormal; Notable for the following components:   WBC 3.0 (*)    RBC 3.67 (*)  Hemoglobin 10.9 (*)    HCT 34.1 (*)    All other components within normal limits  CBG MONITORING, ED - Abnormal; Notable for the following components:   Glucose-Capillary 120 (*)    All other components within normal limits  TROPONIN I (HIGH SENSITIVITY) - Abnormal; Notable for the following components:   Troponin I (High Sensitivity) 21 (*)    All other components within normal limits  TROPONIN I (HIGH SENSITIVITY) - Abnormal; Notable for the following components:   Troponin I (High Sensitivity) 20 (*)    All other components within normal limits    EKG EKG Interpretation  Date/Time:  Saturday August 04 2022 07:32:22 EDT Ventricular Rate:  80 PR Interval:  116 QRS Duration: 178 QT Interval:  490 QTC Calculation: 566 R Axis:   258 Text Interpretation: Accelerated junctional rhythm Nonspecific IVCD with LAD Inferior infarct, old Probable lateral infarct, age indeterminate Anterior infarct, old improved baseline Otherwise no significant change Confirmed by Deno Etienne [phone]) on 08/04/2022 7:59:45 AM  Radiology DG Chest Portable 1 View  Result Date:  08/04/2022 CLINICAL DATA:  Chest pain EXAM: PORTABLE CHEST 1 VIEW COMPARISON:  01/15/2022 FINDINGS: Stable borderline heart size. Normal mediastinal contours. Prior CABG. Unchanged dual-chamber pacer lead positioning. Interstitial coarsening which is chronic based on priors and likely scarring. No acute opacity. IMPRESSION: No acute finding when compared to priors. Electronically Signed   By: Jorje Guild M.D.   On: 08/04/2022 07:54    Procedures Procedures    Medications Ordered in ED Medications  aspirin chewable tablet 324 mg (324 mg Oral Given 08/04/22 0730)    ED Course/ Medical Decision Making/ A&P                             Medical Decision Making Amount and/or Complexity of Data Reviewed Labs: ordered. Radiology: ordered.  Risk OTC drugs.   82 yo M with a chief complaints of chest pain.  At least this was what was reported by EMS.  Patient is demented and does not remember what happened.  He does not have any chest pain now.  He denies any other specific symptoms.  He does not know his medical history.  2 troponins are flat.  Patient continues to be asymptomatic.  Chest x-ray independently interpreted by me without focal infiltrate and pneumothorax.  No significant anemia no significant electrolyte abnormality.  Will discharge home.  PCP follow-up.  9:58 AM:  I have discussed the diagnosis/risks/treatment options with the patient.  Evaluation and diagnostic testing in the emergency department does not suggest an emergent condition requiring admission or immediate intervention beyond what has been performed at this time.  They will follow up with PCP. We also discussed returning to the ED immediately if new or worsening sx occur. We discussed the sx which are most concerning (e.g., sudden worsening pain, fever, inability to tolerate by mouth) that necessitate immediate return. Medications administered to the patient during their visit and any new prescriptions provided to the  patient are listed below.  Medications given during this visit Medications  aspirin chewable tablet 324 mg (324 mg Oral Given 08/04/22 0730)     The patient appears reasonably screen and/or stabilized for discharge and I doubt any other medical condition or other [facility] requiring further screening, evaluation, or treatment in the ED at this time prior to discharge.          Final Clinical Impression(s) / ED Diagnoses Final  diagnoses:  Nonspecific chest pain    Rx / DC Orders ED Discharge Orders     None         Deno Etienne, DO 08/04/22 [phone]

## 2022-08-04 NOTE — Discharge Instructions (Signed)
We did not find an obvious cause for your discomfort earlier.  Please follow-up with your family doctor in the office.  They may have you follow-up with a specialist if there is some ongoing concern.

## 2022-08-04 NOTE — ED Notes (Signed)
Called pt's wife at this time in regards to pt discharge. Pt's daughter to pick up pt soon

## 2022-08-04 NOTE — ED Notes (Signed)
Called pt's wife for update on ride. Pt's daughter should be arriving no later than 1145 this morning.

## 2022-08-04 NOTE — ED Notes (Signed)
This RN notified that charge RN wants pt discharged to lobby to wait for ride.

## 2022-08-04 NOTE — ED Notes (Signed)
AVS reviewed with pt prior to discharge. Pt verbalizes understanding. Belongings with pt upon depart. Pt taken to lobby in wheelchair, per request of charge RN, to wait for ride from daughter.

## 2023-04-09 ENCOUNTER — Telehealth: Payer: Self-pay | Admitting: Internal Medicine

## 2023-04-09 NOTE — Telephone Encounter (Signed)
That is okay.  Thank you 

## 2023-04-09 NOTE — Telephone Encounter (Signed)
Pt's (new pt) would like to know if ok to establish with Dr Drue Novel as New pt, daughter already seen Dr Drue Novel. Please advise.

## 2023-04-16 NOTE — Telephone Encounter (Signed)
LVM to ok to schedule NP appt per [facility].

## 2023-06-26 ENCOUNTER — Ambulatory Visit: Payer: Medicare Other | Admitting: Internal Medicine

## 2023-06-27 ENCOUNTER — Other Ambulatory Visit: Payer: Self-pay

## 2023-06-28 ENCOUNTER — Telehealth: Payer: Self-pay | Admitting: Internal Medicine

## 2023-06-28 NOTE — Telephone Encounter (Signed)
 Pt's daughter is coming to office to pick up ROI form to have filled out by pt before appt to obtain med recs from last provider.

## 2023-07-05 ENCOUNTER — Telehealth: Payer: Self-pay

## 2023-07-05 NOTE — Telephone Encounter (Signed)
Pt has rescheduled NP twice, please do not reschedule/let Pt book w/ Dr. Drue Novel.

## 2023-07-08 ENCOUNTER — Ambulatory Visit: Payer: Medicare Other | Admitting: Internal Medicine

## 2023-07-08 NOTE — Telephone Encounter (Signed)
Added modifiers for dr.Paz so pt cannot reschedule

## 2023-07-08 NOTE — Telephone Encounter (Signed)
He is scheduled to see Dr. Drue Novel on 07/26/23- can you guys make him aware please?

## 2023-07-12 NOTE — Telephone Encounter (Signed)
 Pt.notified

## 2023-07-22 ENCOUNTER — Ambulatory Visit: Payer: Medicare Other | Admitting: Internal Medicine

## 2023-07-26 ENCOUNTER — Encounter: Payer: Self-pay | Admitting: Internal Medicine

## 2023-07-26 ENCOUNTER — Ambulatory Visit (INDEPENDENT_AMBULATORY_CARE_PROVIDER_SITE_OTHER): Payer: Medicare Other | Admitting: Internal Medicine

## 2023-07-26 VITALS — BP 100/60 | HR 80 | Resp 18 | Ht 70.0 in | Wt 134.4 lb

## 2023-07-26 DIAGNOSIS — D520 Dietary folate deficiency anemia: Secondary | ICD-10-CM

## 2023-07-26 DIAGNOSIS — E785 Hyperlipidemia, unspecified: Secondary | ICD-10-CM

## 2023-07-26 DIAGNOSIS — I739 Peripheral vascular disease, unspecified: Secondary | ICD-10-CM | POA: Insufficient documentation

## 2023-07-26 DIAGNOSIS — I2510 Atherosclerotic heart disease of native coronary artery without angina pectoris: Secondary | ICD-10-CM

## 2023-07-26 DIAGNOSIS — Q210 Ventricular septal defect: Secondary | ICD-10-CM | POA: Diagnosis not present

## 2023-07-26 DIAGNOSIS — E876 Hypokalemia: Secondary | ICD-10-CM

## 2023-07-26 DIAGNOSIS — F0390 Unspecified dementia without behavioral disturbance: Secondary | ICD-10-CM | POA: Diagnosis not present

## 2023-07-26 NOTE — Patient Instructions (Signed)
 We are referring you to cardiology, they should be contacting you.  GO TO THE LAB : Get the blood work     Please go to the front desk: Schedule follow-up in 2 months

## 2023-07-26 NOTE — Progress Notes (Signed)
   Subjective:    Patient ID: Manuel Vargas, male    DOB: Jan 12, 1941, 83 y.o.   MRN: [id number]  DOS:  07/26/2023 Type of visit - description: New patient, referred by his daughter-in-law who is my patient  Here as a new patient. In general reports he is feeling well. He has dementia. Denies chest pain, difficulty breathing, no claudication. Has occasional nausea but no vomiting. Has occasional cough in the mornings.  Review of Systems See above   Past Medical History:  Diagnosis Date   Anemia    CAD (coronary artery disease)    Dementia (HCC)    VSD (ventricular septal defect)     Past Surgical History:  Procedure Laterality Date   VSD REPAIR      No current outpatient medications     Objective:   Physical Exam BP 100/60   Pulse 80   Resp 18   Ht 5\' 10"  (1.778 m)   Wt 134 lb 6.4 oz (61 kg)   SpO2 100%   BMI 19.28 kg/m  General:   Well developed, chronically year and underweight appearing but in no acute distress. HEENT:  Normocephalic . Face symmetric, atraumatic.  Slightly pale? Lungs:  CTA B Normal respiratory effort, no intercostal retractions, no accessory muscle use. Heart: RRR, systolic murmur.  Lower extremities: no pretibial edema bilaterally Upper extremities: Early clubbing Skin: Not pale. Not jaundice Neurologic:  alert, pleasant, cooperative.  Oriented to self.  Not oriented to time and place.  Watches the news but does not know the name of the president. Recognizes his family. Speech normal Gait: Limited, needs some help, using a wheelchair. Psych-- Behavior appropriate. No anxious or depressed appearing.      Assessment    Problem list (new patient 07/26/2023) Dyslipidemia  VSD? Open heart surgery years ago (in his 86s?) Pacemaker 2013, battery changed ~ 2023? PVD Megaloblastic anemia Dementia  Former heavy smoker  PLAN Dyslipidemia, pacemaker, VSD repair, peripheral vascular disease, megaloblastic anemia, dementia. This is a first  visit of this gentleman to the office. Reportedly he was taking a number of medications but stopped them over a year ago. He feels well with no angina or claudication that he can report. The family reports that once he stopped the medication   he is actually doing better, previously had frequently falls but that is not the case anymore.  Apparently he was drinking alcohol, unclear how much but he has not been drinking much lately.  . I took a picture of his pacemaker if information and his former cardiologist contact numbers. Plan: Labs :CMP FLP CBC A1c TSH B12 folic acid Refer to local cardiology. Get records from cardiology. Weight loss: At the end of the visit, they also said that he has been losing weight for 1 year (?) Vaccinations: Declined RTC 2 months.

## 2023-07-27 ENCOUNTER — Encounter: Payer: Self-pay | Admitting: Internal Medicine

## 2023-07-27 DIAGNOSIS — Z09 Encounter for follow-up examination after completed treatment for conditions other than malignant neoplasm: Secondary | ICD-10-CM | POA: Insufficient documentation

## 2023-07-27 DIAGNOSIS — Z7189 Other specified counseling: Secondary | ICD-10-CM | POA: Insufficient documentation

## 2023-07-27 LAB — COMPREHENSIVE METABOLIC PANEL
AG Ratio: 0.7 (calc) — ABNORMAL LOW (ref 1.0–2.5)
ALT: 4 U/L — ABNORMAL LOW (ref 9–46)
AST: 12 U/L (ref 10–35)
Albumin: 3.1 g/dL — ABNORMAL LOW (ref 3.6–5.1)
Alkaline phosphatase (APISO): 79 U/L (ref 35–144)
BUN: 11 mg/dL (ref 7–25)
CO2: 25 mmol/L (ref 20–32)
Calcium: 8.8 mg/dL (ref 8.6–10.3)
Chloride: 104 mmol/L (ref 98–110)
Creat: 0.81 mg/dL (ref 0.70–1.22)
Globulin: 4.2 g/dL — ABNORMAL HIGH (ref 1.9–3.7)
Glucose, Bld: 92 mg/dL (ref 65–99)
Potassium: 3.3 mmol/L — ABNORMAL LOW (ref 3.5–5.3)
Sodium: 141 mmol/L (ref 135–146)
Total Bilirubin: 0.7 mg/dL (ref 0.2–1.2)
Total Protein: 7.3 g/dL (ref 6.1–8.1)

## 2023-07-27 LAB — CBC WITH DIFFERENTIAL/PLATELET
Absolute Lymphocytes: 904 {cells}/uL (ref 850–3900)
Absolute Monocytes: 555 {cells}/uL (ref 200–950)
Basophils Absolute: 72 {cells}/uL (ref 0–200)
Basophils Relative: 1.9 %
Eosinophils Absolute: 0 {cells}/uL — ABNORMAL LOW (ref 15–500)
Eosinophils Relative: 0 %
HCT: 30.2 % — ABNORMAL LOW (ref 38.5–50.0)
Hemoglobin: 9.5 g/dL — ABNORMAL LOW (ref 13.2–17.1)
MCH: 28.4 pg (ref 27.0–33.0)
MCHC: 31.5 g/dL — ABNORMAL LOW (ref 32.0–36.0)
MCV: 90.1 fL (ref 80.0–100.0)
MPV: 12.4 fL (ref 7.5–12.5)
Monocytes Relative: 14.6 %
Neutro Abs: 2269 {cells}/uL (ref 1500–7800)
Neutrophils Relative %: 59.7 %
Platelets: 279 10*3/uL (ref 140–400)
RBC: 3.35 10*6/uL — ABNORMAL LOW (ref 4.20–5.80)
RDW: 13.8 % (ref 11.0–15.0)
Total Lymphocyte: 23.8 %
WBC: 3.8 10*3/uL (ref 3.8–10.8)

## 2023-07-27 LAB — B12 AND FOLATE PANEL
Folate: 3.7 ng/mL — ABNORMAL LOW
Vitamin B-12: 309 pg/mL (ref 200–1100)

## 2023-07-27 LAB — LIPID PANEL
Cholesterol: 148 mg/dL (ref <200)
HDL: 31 mg/dL — ABNORMAL LOW (ref >=40)
LDL Cholesterol (Calc): 102 mg/dL — ABNORMAL HIGH
Non-HDL Cholesterol (Calc): 117 mg/dL (ref <130)
Total CHOL/HDL Ratio: 4.8 (calc) (ref <5.0)
Triglycerides: 67 mg/dL (ref <150)

## 2023-07-27 LAB — HEMOGLOBIN A1C
Hgb A1c MFr Bld: 5.5 %{Hb} (ref <5.7)
Mean Plasma Glucose: 111 mg/dL
eAG (mmol/L): 6.2 mmol/L

## 2023-07-27 LAB — TSH: TSH: 1.73 m[IU]/L (ref 0.40–4.50)

## 2023-07-27 NOTE — Assessment & Plan Note (Signed)
 Dyslipidemia, pacemaker, VSD repair, peripheral vascular disease, megaloblastic anemia, dementia. This is a first visit of this gentleman to the office. Reportedly he was taking a number of medications but stopped them over a year ago. He feels well with no angina or claudication that he can report. The family reports that once he stopped the medication   he is actually doing better, previously had frequently falls but that is not the case anymore.  Apparently he was drinking alcohol, unclear how much but he has not been drinking much lately.  . I took a picture of his pacemaker if information and his former cardiologist contact numbers. Plan: Labs :CMP FLP CBC A1c TSH B12 folic acid Refer to local cardiology. Get records from cardiology. Weight loss: At the end of the visit, they also said that he has been losing weight for 1 year (?) Vaccinations: Declined RTC 2 months.

## 2023-07-30 ENCOUNTER — Encounter: Payer: Self-pay | Admitting: Internal Medicine

## 2023-07-30 MED ORDER — POTASSIUM CHLORIDE CRYS ER 10 MEQ PO TBCR: 10.0000 meq | EXTENDED_RELEASE_TABLET | ORAL | 1 refills | Status: DC

## 2023-07-30 NOTE — Addendum Note (Signed)
 Addended byConrad Sawyer D on: 07/30/2023 10:15 AM   Modules accepted: Orders

## 2023-07-31 ENCOUNTER — Encounter: Payer: Self-pay | Admitting: Internal Medicine

## 2023-08-22 ENCOUNTER — Telehealth: Payer: Self-pay

## 2023-08-22 ENCOUNTER — Encounter: Payer: Self-pay | Admitting: Internal Medicine

## 2023-08-22 DIAGNOSIS — R601 Generalized edema: Secondary | ICD-10-CM | POA: Insufficient documentation

## 2023-08-22 DIAGNOSIS — I209 Angina pectoris, unspecified: Secondary | ICD-10-CM | POA: Insufficient documentation

## 2023-08-22 DIAGNOSIS — K760 Fatty (change of) liver, not elsewhere classified: Secondary | ICD-10-CM | POA: Insufficient documentation

## 2023-08-22 DIAGNOSIS — E1169 Type 2 diabetes mellitus with other specified complication: Secondary | ICD-10-CM

## 2023-08-22 DIAGNOSIS — Z9861 Coronary angioplasty status: Secondary | ICD-10-CM | POA: Insufficient documentation

## 2023-08-22 DIAGNOSIS — R0600 Dyspnea, unspecified: Secondary | ICD-10-CM | POA: Insufficient documentation

## 2023-08-22 DIAGNOSIS — K5790 Diverticulosis of intestine, part unspecified, without perforation or abscess without bleeding: Secondary | ICD-10-CM | POA: Insufficient documentation

## 2023-08-22 DIAGNOSIS — E119 Type 2 diabetes mellitus without complications: Secondary | ICD-10-CM | POA: Insufficient documentation

## 2023-08-22 DIAGNOSIS — K219 Gastro-esophageal reflux disease without esophagitis: Secondary | ICD-10-CM | POA: Insufficient documentation

## 2023-08-22 DIAGNOSIS — I714 Abdominal aortic aneurysm, without rupture, unspecified: Secondary | ICD-10-CM | POA: Insufficient documentation

## 2023-08-22 DIAGNOSIS — I4891 Unspecified atrial fibrillation: Secondary | ICD-10-CM | POA: Insufficient documentation

## 2023-08-22 NOTE — Telephone Encounter (Signed)
 Medical records- 137 pages received from [facility], records placed in PCP yellow folder to be reviewed. Most are very old records, I've reached back out to cardiology to see if they have anything within the last 5 years.

## 2023-08-25 IMAGING — DX DG PORTABLE PELVIS
1 series · 1 of 1 positions shown · non-contrast
Comparison: None

CLINICAL DATA: Status post fall.

EXAM:
PORTABLE PELVIS 1-2 VIEWS

[pelvis ap]
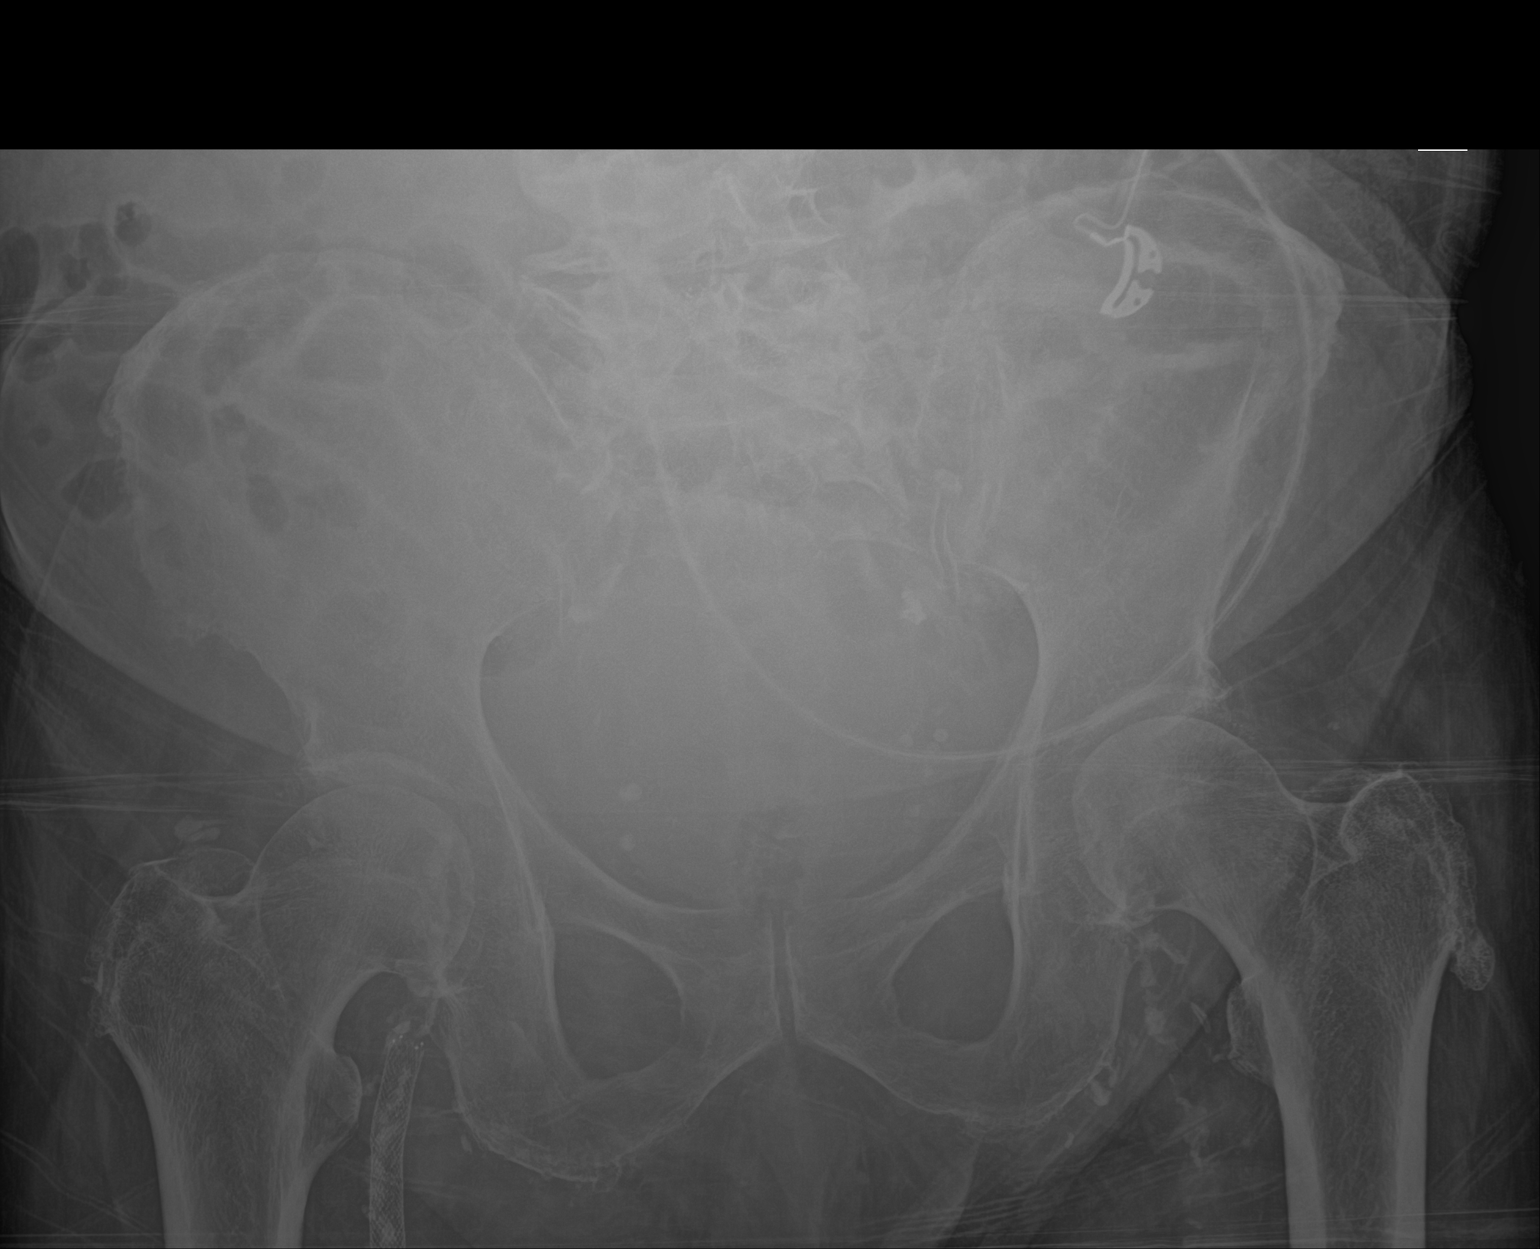

[1 of 1 positions shown; findings below may reference images not displayed]

FINDINGS: There is no evidence of pelvic fracture or diastasis. No pelvic bone
lesions are seen. Lumbar degenerative disc disease. Vascular
calcifications identified. Metallic vascular stent identified within
the right groin.
IMPRESSION: 1. No acute findings.
2. Lumbar degenerative disc disease.

## 2023-08-25 IMAGING — CT CT CERVICAL SPINE W/O CM
3 of 4 series · 13 of 35 positions shown, 16 images · non-contrast
Comparison: None Available.

CLINICAL DATA: Head trauma.  Level 2 fall on blood thinners.



[Series 4: orthogonal axials · oblique · 0.21mm/px · 5 of 91 slices shown, 7 images]
[im 16/91  soft-tissue]
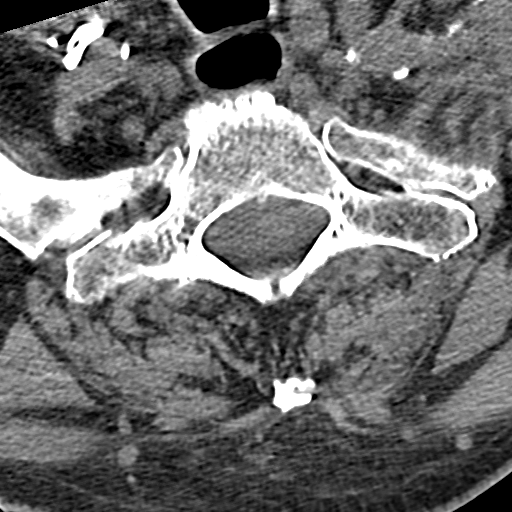
[im 16/91  bone]
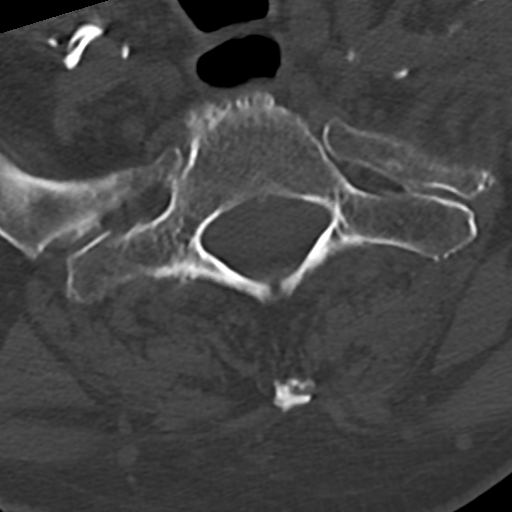
[im 31/91  bone]
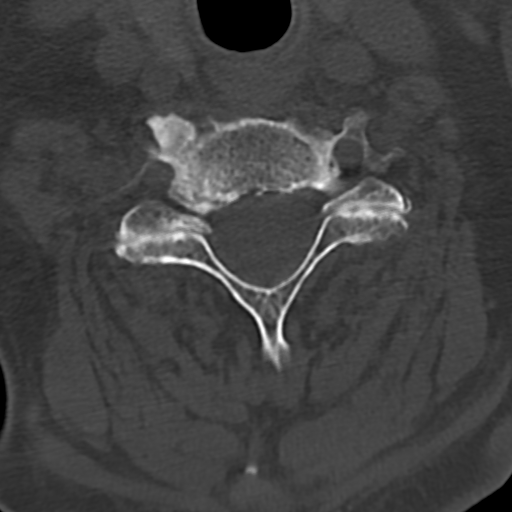
[im 46/91  bone]
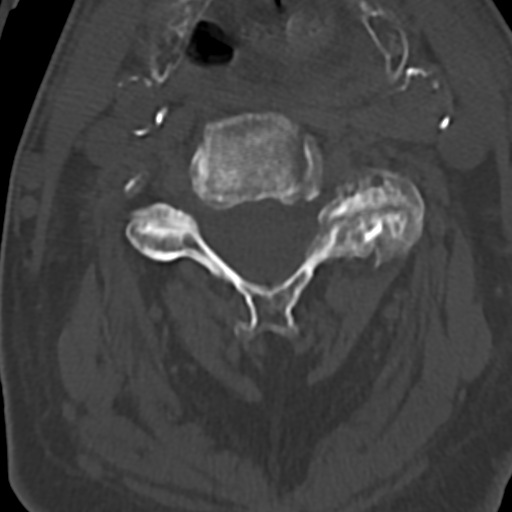
[im 61/91  bone]
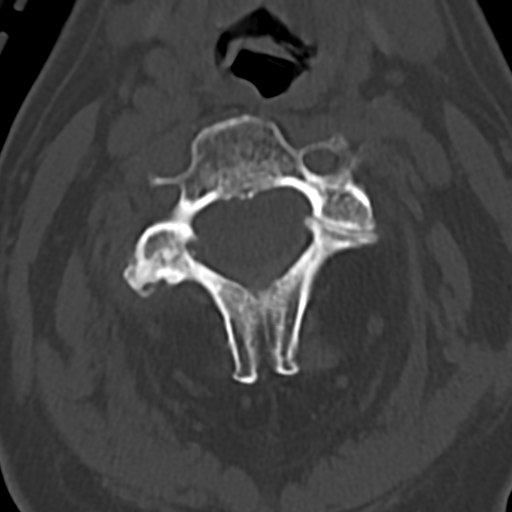
[im 76/91  soft-tissue]
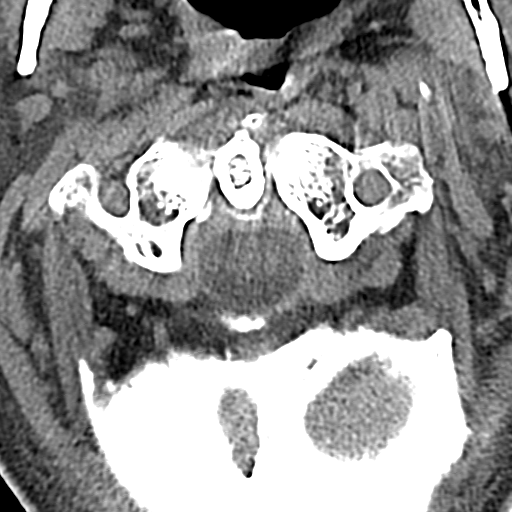
[im 76/91  bone]
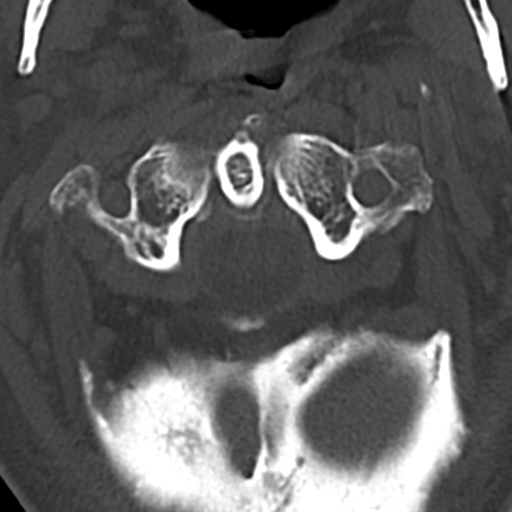

[Series 9: sag bone · sagittal · 0.35mm/px · 5 of 63 slices shown, 6 images]
[im 21/63  bone]
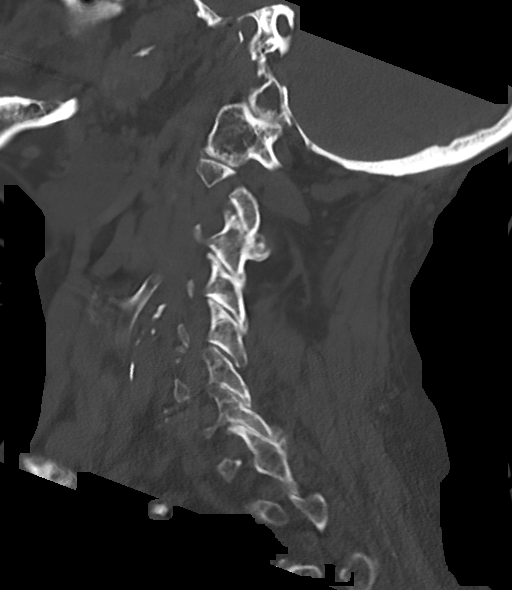
[im 26/63  bone]
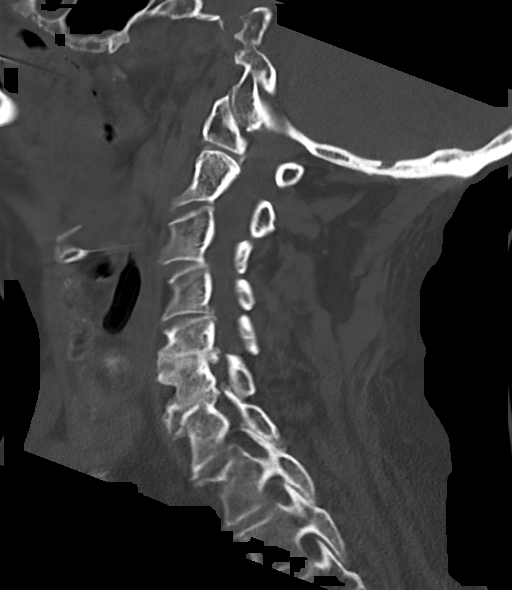
[im 32/63  soft-tissue]
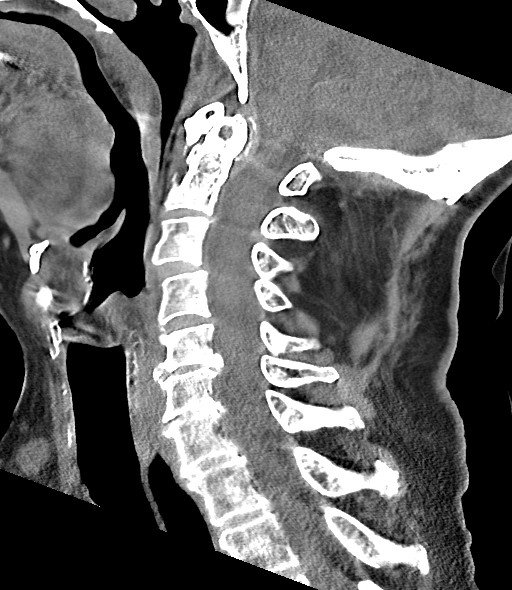
[im 32/63  bone]
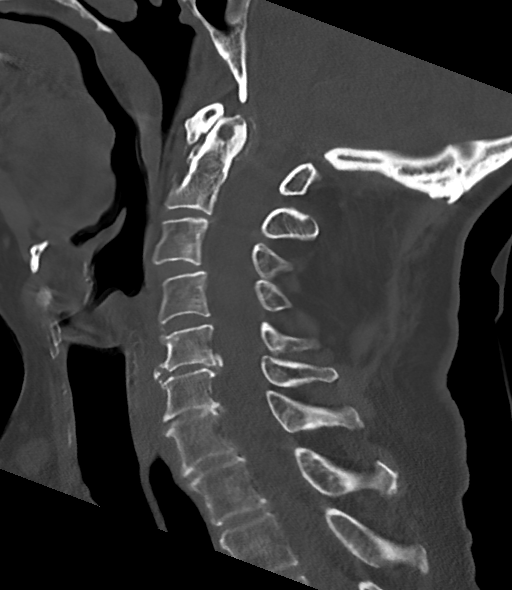
[im 37/63  bone]
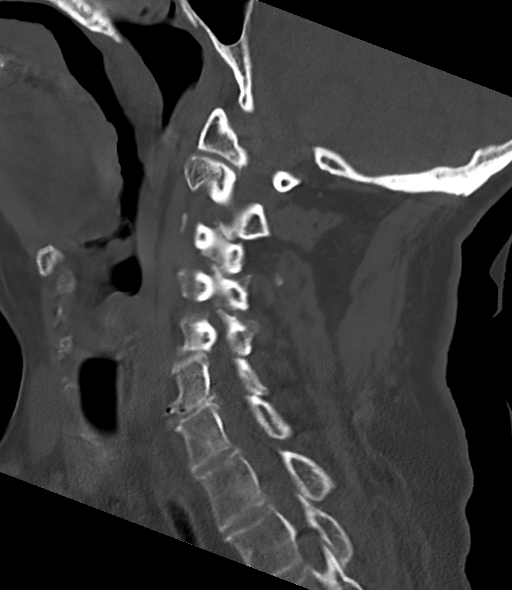
[im 42/63  bone]
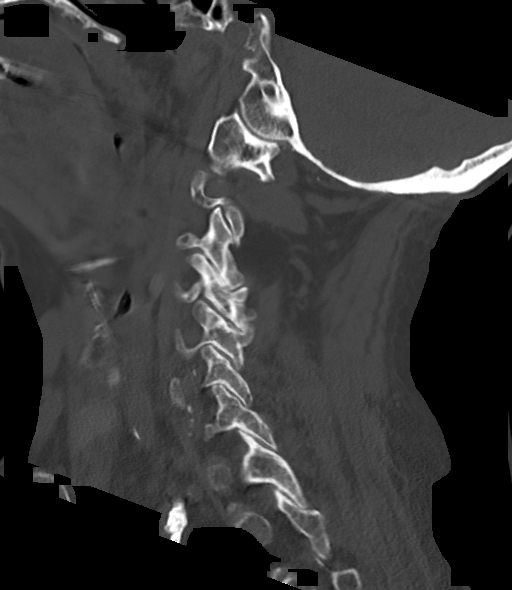

[Series 10: cor bone · coronal · 0.28mm/px · 3 of 89 slices shown]
[im 24/89  bone]
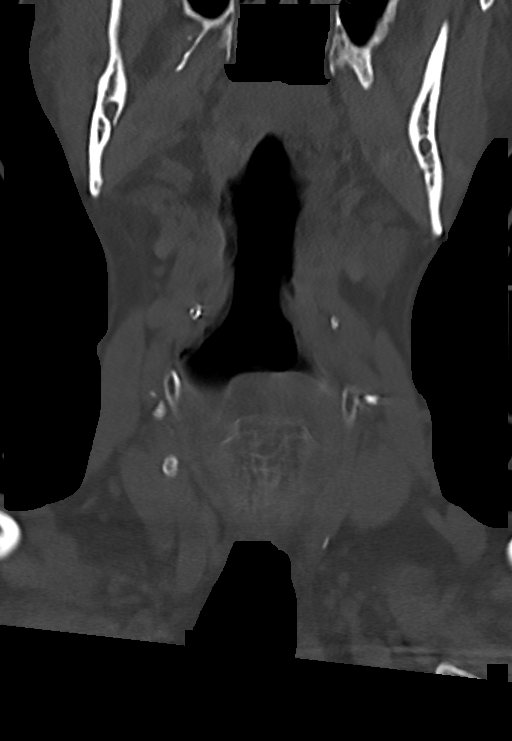
[im 38/89  bone]
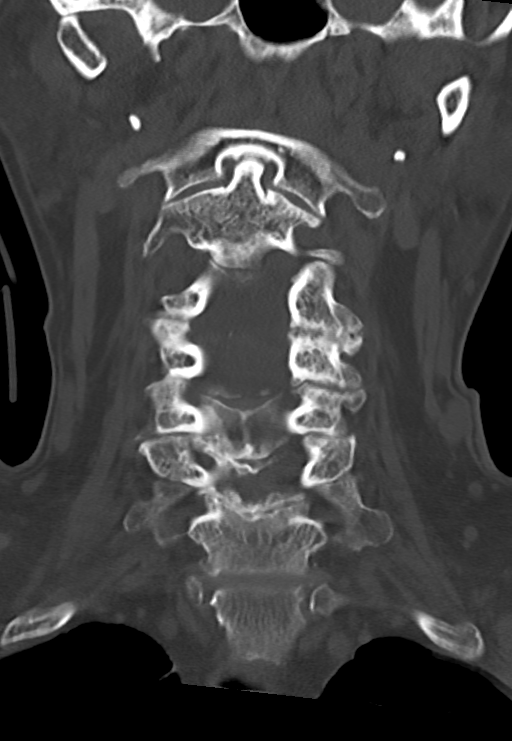
[im 52/89  bone]
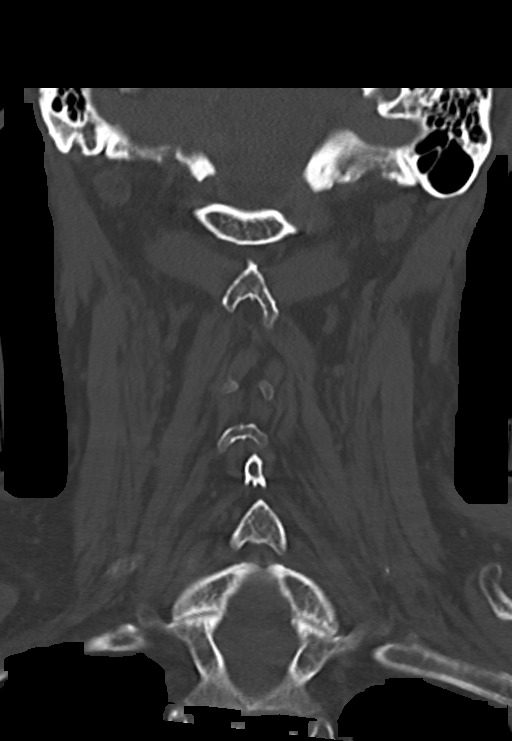

[13 of 35 positions shown; findings below may reference images not displayed]

FINDINGS: CT HEAD FINDINGS

Brain: No evidence of acute infarction, hemorrhage, hydrocephalus,
extra-axial collection or mass lesion/mass effect. Generalized brain
atrophy.

Vascular: No hyperdense vessel or unexpected calcification.

Skull: Normal. Negative for fracture or focal lesion.

Sinuses/Orbits: No acute finding.  Bilateral cataract resection

CT CERVICAL SPINE FINDINGS

Alignment: No traumatic malalignment. Degenerative appearing C3-4
anterolisthesis.

Skull base and vertebrae: No acute fracture

Soft tissues and spinal canal: No prevertebral fluid or swelling. No
visible canal hematoma.

Disc levels:  Generalized cervical spine degeneration.

Upper chest: No acute finding.  Biapical emphysema.
IMPRESSION: No evidence of intracranial or cervical spine injury.

## 2023-08-25 IMAGING — DX DG CHEST 1V PORT
1 series · 1 of 1 positions shown · non-contrast
Comparison: None Available.

CLINICAL DATA: Status post level 2 trauma.  Fall.  Anticoagulated.

EXAM:
PORTABLE CHEST 1 VIEW

[chest ap]
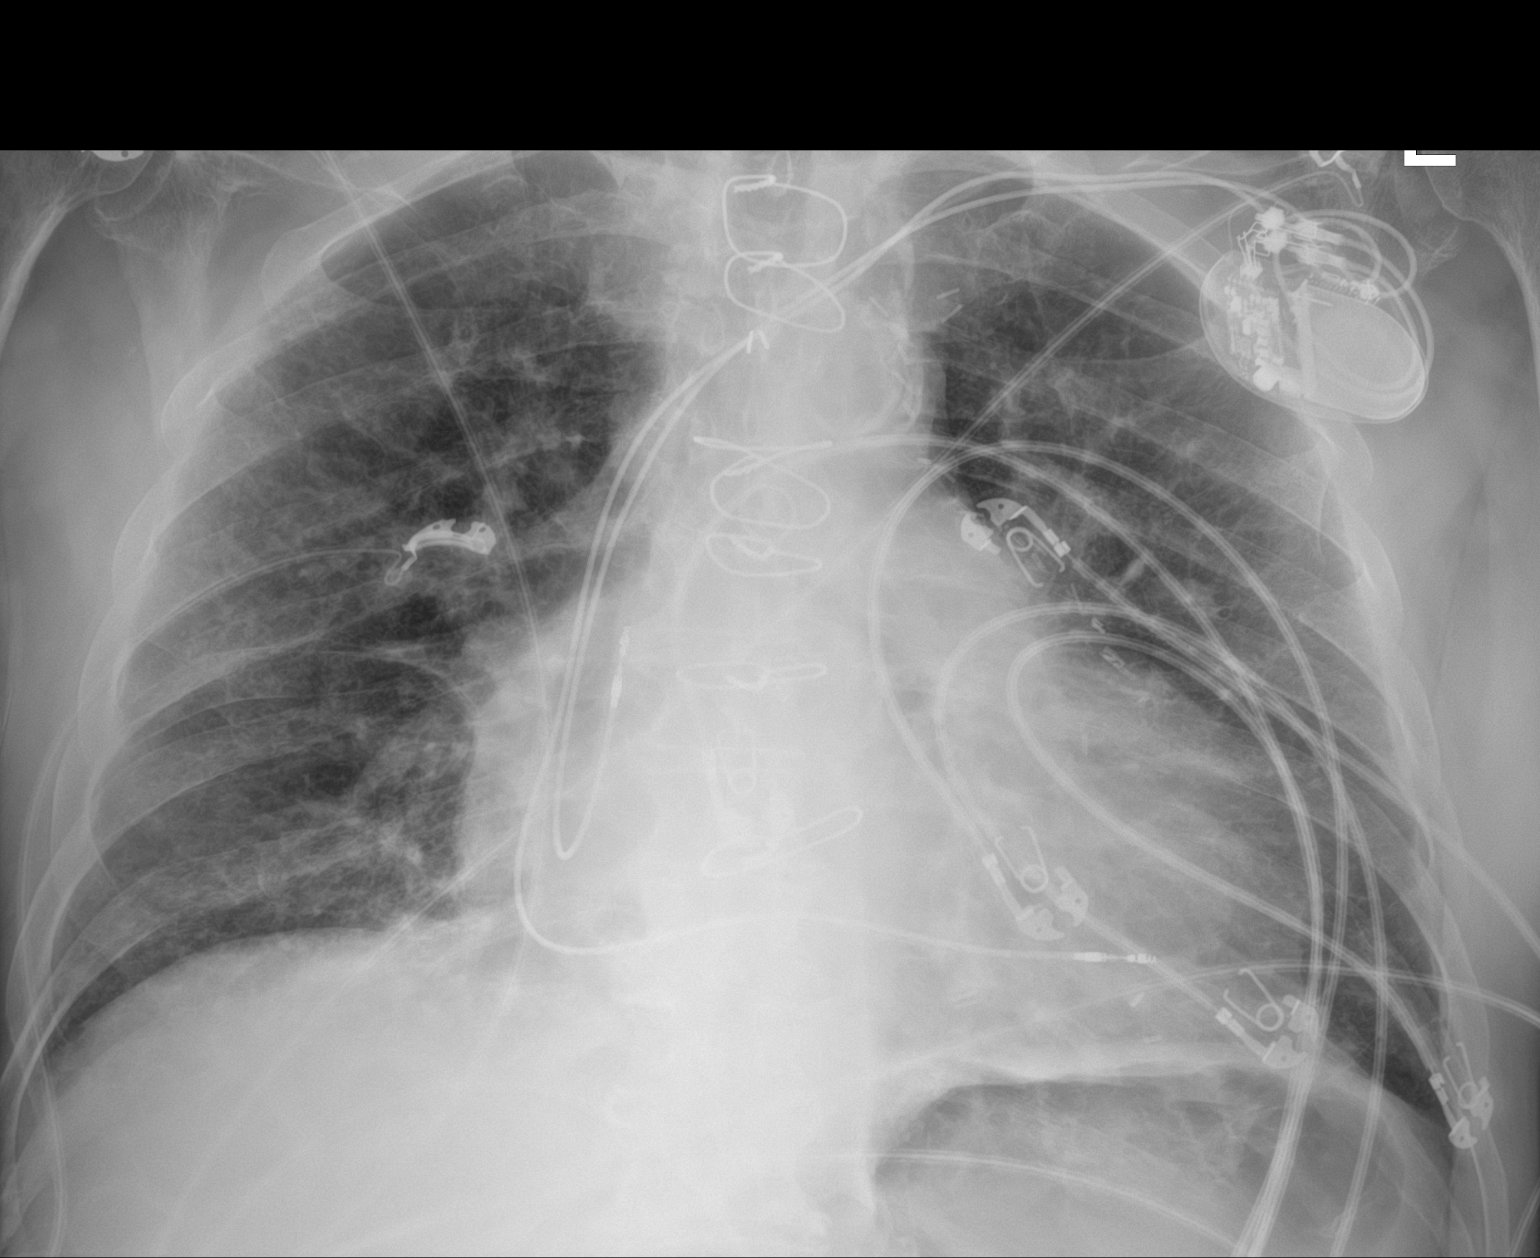

[1 of 1 positions shown; findings below may reference images not displayed]

FINDINGS: Left chest wall pacer device noted with leads in the right atrial
appendage and right ventricle. Previous median sternotomy and CABG
procedure. Aortic atherosclerotic calcifications. Lung volumes are
low. No pleural effusion. Chronic interstitial coarsening noted
bilaterally. Mild asymmetric increased hazy opacities throughout the
right lung may represent asymmetric edema or pneumonia. The
visualized osseous structures appear intact.
IMPRESSION: 1. Asymmetric increased hazy opacities within the right lung may
represent asymmetric edema or pneumonia.
2. Chronic interstitial coarsening.

## 2023-09-02 NOTE — Telephone Encounter (Signed)
 100s  of pages reviewed, records are from several years ago. They confirm the history of: AAA, A-fib, CAD, HTN, DM, high cholesterol, PVD. Had a lower extremity angiogram 01/13/2018.  Will send records to be scanned for future reference

## 2023-09-02 NOTE — Telephone Encounter (Signed)
Records sent for scanning

## 2023-09-03 ENCOUNTER — Encounter: Payer: Self-pay | Admitting: Internal Medicine

## 2023-09-04 NOTE — Telephone Encounter (Signed)
 Received records from [facility] in [place], [place]. I had asked if they had anything within the last 5 years, they had an admission from 2022. Placed in PCP red yellow folder for review.

## 2023-09-05 ENCOUNTER — Telehealth: Payer: Self-pay | Admitting: Internal Medicine

## 2023-09-05 NOTE — Telephone Encounter (Signed)
 Copied from CRM [id number]. Topic: Medicare AWV >> Sep 05, 2023 11:16 AM Juliana Ocean wrote: Reason for CRM: Called 09/05/2023 to sched AWV - NO VOICEMAIL  Rosalee Collins; Care Guide Ambulatory Clinical Support [facility] l [facility] Direct Dial: [phone]

## 2023-09-09 ENCOUNTER — Encounter: Payer: Self-pay | Admitting: Internal Medicine

## 2023-09-09 LAB — PTH, INTACT: PTH, Intact: 49

## 2023-09-09 NOTE — Telephone Encounter (Signed)
 Medical Records received from [facility], records placed in PCP yellow folder for review.

## 2023-09-11 NOTE — Telephone Encounter (Signed)
 reviewed

## 2023-09-11 NOTE — Telephone Encounter (Signed)
Records sent for scanning

## 2023-10-30 ENCOUNTER — Other Ambulatory Visit

## 2023-12-09 ENCOUNTER — Emergency Department (HOSPITAL_COMMUNITY)

## 2023-12-09 ENCOUNTER — Inpatient Hospital Stay (HOSPITAL_COMMUNITY)
Admission: EM | Admit: 2023-12-09 | Discharge: 2023-12-20 | DRG: 260 | Disposition: E | Attending: Internal Medicine | Admitting: Internal Medicine

## 2023-12-09 ENCOUNTER — Encounter (HOSPITAL_COMMUNITY): Payer: Self-pay

## 2023-12-09 ENCOUNTER — Encounter (HOSPITAL_COMMUNITY): Admission: EM | Disposition: E | Payer: Self-pay | Source: Home / Self Care | Attending: Internal Medicine

## 2023-12-09 ENCOUNTER — Other Ambulatory Visit: Payer: Self-pay

## 2023-12-09 DIAGNOSIS — K922 Gastrointestinal hemorrhage, unspecified: Secondary | ICD-10-CM

## 2023-12-09 DIAGNOSIS — R64 Cachexia: Secondary | ICD-10-CM | POA: Diagnosis present

## 2023-12-09 DIAGNOSIS — I4721 Torsades de pointes: Secondary | ICD-10-CM | POA: Diagnosis present

## 2023-12-09 DIAGNOSIS — R0902 Hypoxemia: Secondary | ICD-10-CM | POA: Diagnosis not present

## 2023-12-09 DIAGNOSIS — F05 Delirium due to known physiological condition: Secondary | ICD-10-CM | POA: Diagnosis present

## 2023-12-09 DIAGNOSIS — I119 Hypertensive heart disease without heart failure: Secondary | ICD-10-CM | POA: Diagnosis present

## 2023-12-09 DIAGNOSIS — I2510 Atherosclerotic heart disease of native coronary artery without angina pectoris: Secondary | ICD-10-CM | POA: Diagnosis present

## 2023-12-09 DIAGNOSIS — E1151 Type 2 diabetes mellitus with diabetic peripheral angiopathy without gangrene: Secondary | ICD-10-CM | POA: Diagnosis present

## 2023-12-09 DIAGNOSIS — Z955 Presence of coronary angioplasty implant and graft: Secondary | ICD-10-CM

## 2023-12-09 DIAGNOSIS — I442 Atrioventricular block, complete: Secondary | ICD-10-CM | POA: Diagnosis present

## 2023-12-09 DIAGNOSIS — T82119A Breakdown (mechanical) of unspecified cardiac electronic device, initial encounter: Principal | ICD-10-CM | POA: Diagnosis present

## 2023-12-09 DIAGNOSIS — I4819 Other persistent atrial fibrillation: Secondary | ICD-10-CM | POA: Diagnosis present

## 2023-12-09 DIAGNOSIS — I4891 Unspecified atrial fibrillation: Secondary | ICD-10-CM | POA: Diagnosis not present

## 2023-12-09 DIAGNOSIS — Z8774 Personal history of (corrected) congenital malformations of heart and circulatory system: Secondary | ICD-10-CM

## 2023-12-09 DIAGNOSIS — K920 Hematemesis: Secondary | ICD-10-CM | POA: Diagnosis present

## 2023-12-09 DIAGNOSIS — I6523 Occlusion and stenosis of bilateral carotid arteries: Secondary | ICD-10-CM | POA: Diagnosis present

## 2023-12-09 DIAGNOSIS — Z7189 Other specified counseling: Secondary | ICD-10-CM | POA: Diagnosis not present

## 2023-12-09 DIAGNOSIS — J9811 Atelectasis: Secondary | ICD-10-CM | POA: Diagnosis present

## 2023-12-09 DIAGNOSIS — D529 Folate deficiency anemia, unspecified: Secondary | ICD-10-CM | POA: Diagnosis present

## 2023-12-09 DIAGNOSIS — R001 Bradycardia, unspecified: Principal | ICD-10-CM | POA: Diagnosis present

## 2023-12-09 DIAGNOSIS — Z66 Do not resuscitate: Secondary | ICD-10-CM | POA: Diagnosis not present

## 2023-12-09 DIAGNOSIS — J90 Pleural effusion, not elsewhere classified: Secondary | ICD-10-CM | POA: Diagnosis present

## 2023-12-09 DIAGNOSIS — N170 Acute kidney failure with tubular necrosis: Secondary | ICD-10-CM | POA: Diagnosis present

## 2023-12-09 DIAGNOSIS — E1169 Type 2 diabetes mellitus with other specified complication: Secondary | ICD-10-CM | POA: Diagnosis not present

## 2023-12-09 DIAGNOSIS — D520 Dietary folate deficiency anemia: Secondary | ICD-10-CM | POA: Diagnosis present

## 2023-12-09 DIAGNOSIS — Z9862 Peripheral vascular angioplasty status: Secondary | ICD-10-CM

## 2023-12-09 DIAGNOSIS — F03911 Unspecified dementia, unspecified severity, with agitation: Secondary | ICD-10-CM | POA: Diagnosis present

## 2023-12-09 DIAGNOSIS — I480 Paroxysmal atrial fibrillation: Secondary | ICD-10-CM

## 2023-12-09 DIAGNOSIS — F0390 Unspecified dementia without behavioral disturbance: Secondary | ICD-10-CM | POA: Diagnosis not present

## 2023-12-09 DIAGNOSIS — E785 Hyperlipidemia, unspecified: Secondary | ICD-10-CM | POA: Diagnosis present

## 2023-12-09 DIAGNOSIS — Y712 Prosthetic and other implants, materials and accessory cardiovascular devices associated with adverse incidents: Secondary | ICD-10-CM | POA: Diagnosis present

## 2023-12-09 DIAGNOSIS — Z950 Presence of cardiac pacemaker: Secondary | ICD-10-CM | POA: Diagnosis not present

## 2023-12-09 DIAGNOSIS — Z951 Presence of aortocoronary bypass graft: Secondary | ICD-10-CM

## 2023-12-09 DIAGNOSIS — K760 Fatty (change of) liver, not elsewhere classified: Secondary | ICD-10-CM | POA: Diagnosis present

## 2023-12-09 DIAGNOSIS — I4901 Ventricular fibrillation: Secondary | ICD-10-CM | POA: Diagnosis present

## 2023-12-09 DIAGNOSIS — E119 Type 2 diabetes mellitus without complications: Secondary | ICD-10-CM

## 2023-12-09 DIAGNOSIS — Z515 Encounter for palliative care: Secondary | ICD-10-CM

## 2023-12-09 DIAGNOSIS — D531 Other megaloblastic anemias, not elsewhere classified: Secondary | ICD-10-CM | POA: Diagnosis present

## 2023-12-09 DIAGNOSIS — R55 Syncope and collapse: Secondary | ICD-10-CM | POA: Insufficient documentation

## 2023-12-09 DIAGNOSIS — I739 Peripheral vascular disease, unspecified: Secondary | ICD-10-CM | POA: Diagnosis not present

## 2023-12-09 DIAGNOSIS — K219 Gastro-esophageal reflux disease without esophagitis: Secondary | ICD-10-CM | POA: Diagnosis present

## 2023-12-09 DIAGNOSIS — I4729 Other ventricular tachycardia: Secondary | ICD-10-CM

## 2023-12-09 DIAGNOSIS — K7200 Acute and subacute hepatic failure without coma: Secondary | ICD-10-CM | POA: Diagnosis present

## 2023-12-09 DIAGNOSIS — I714 Abdominal aortic aneurysm, without rupture, unspecified: Secondary | ICD-10-CM | POA: Diagnosis present

## 2023-12-09 DIAGNOSIS — Z8613 Personal history of malaria: Secondary | ICD-10-CM

## 2023-12-09 DIAGNOSIS — Z681 Body mass index (BMI) 19 or less, adult: Secondary | ICD-10-CM

## 2023-12-09 DIAGNOSIS — I495 Sick sinus syndrome: Secondary | ICD-10-CM | POA: Diagnosis present

## 2023-12-09 DIAGNOSIS — R7989 Other specified abnormal findings of blood chemistry: Secondary | ICD-10-CM | POA: Diagnosis not present

## 2023-12-09 DIAGNOSIS — E43 Unspecified severe protein-calorie malnutrition: Secondary | ICD-10-CM | POA: Diagnosis present

## 2023-12-09 DIAGNOSIS — Z8679 Personal history of other diseases of the circulatory system: Secondary | ICD-10-CM

## 2023-12-09 DIAGNOSIS — Z87891 Personal history of nicotine dependence: Secondary | ICD-10-CM

## 2023-12-09 HISTORY — PX: TEMPORARY PACEMAKER: CATH118268

## 2023-12-09 LAB — CBC WITH DIFFERENTIAL/PLATELET
Abs Immature Granulocytes: 0 K/uL (ref 0.00–0.07)
Basophils Absolute: 0 K/uL (ref 0.0–0.1)
Basophils Relative: 0 %
Eosinophils Absolute: 0 K/uL (ref 0.0–0.5)
Eosinophils Relative: 0 %
HCT: 25.5 % — ABNORMAL LOW (ref 39.0–52.0)
Hemoglobin: 8 g/dL — ABNORMAL LOW (ref 13.0–17.0)
Lymphocytes Relative: 14 %
Lymphs Abs: 0.7 K/uL (ref 0.7–4.0)
MCH: 29.4 pg (ref 26.0–34.0)
MCHC: 31.4 g/dL (ref 30.0–36.0)
MCV: 93.8 fL (ref 80.0–100.0)
Monocytes Absolute: 0.4 K/uL (ref 0.1–1.0)
Monocytes Relative: 7 %
Neutro Abs: 4 K/uL (ref 1.7–7.7)
Neutrophils Relative %: 79 %
Platelets: 256 K/uL (ref 150–400)
RBC: 2.72 MIL/uL — ABNORMAL LOW (ref 4.22–5.81)
RDW: 16.3 % — ABNORMAL HIGH (ref 11.5–15.5)
WBC: 5 K/uL (ref 4.0–10.5)
nRBC: 0 % (ref 0.0–0.2)
nRBC: 0 /100{WBCs}

## 2023-12-09 LAB — POC OCCULT BLOOD, ED: Fecal Occult Bld: NEGATIVE

## 2023-12-09 LAB — TROPONIN I (HIGH SENSITIVITY)
Troponin I (High Sensitivity): 69 ng/L — ABNORMAL HIGH (ref <18)
Troponin I (High Sensitivity): 75 ng/L — ABNORMAL HIGH (ref <18)

## 2023-12-09 LAB — MAGNESIUM
Magnesium: 1.9 mg/dL (ref 1.7–2.4)
Magnesium: 2.2 mg/dL (ref 1.7–2.4)

## 2023-12-09 LAB — COMPREHENSIVE METABOLIC PANEL WITH GFR
ALT: 53 U/L — ABNORMAL HIGH (ref 0–44)
ALT: 115 U/L — ABNORMAL HIGH (ref 0–44)
AST: 106 U/L — ABNORMAL HIGH (ref 15–41)
AST: 228 U/L — ABNORMAL HIGH (ref 15–41)
Albumin: 2.9 g/dL — ABNORMAL LOW (ref 3.5–5.0)
Albumin: 3 g/dL — ABNORMAL LOW (ref 3.5–5.0)
Alkaline Phosphatase: 70 U/L (ref 38–126)
Alkaline Phosphatase: 80 U/L (ref 38–126)
Anion gap: 11 (ref 5–15)
Anion gap: 17 — ABNORMAL HIGH (ref 5–15)
BUN: 18 mg/dL (ref 8–23)
BUN: 17 mg/dL (ref 8–23)
CO2: 20 mmol/L — ABNORMAL LOW (ref 22–32)
CO2: 18 mmol/L — ABNORMAL LOW (ref 22–32)
Calcium: 9.5 mg/dL (ref 8.9–10.3)
Calcium: 9.7 mg/dL (ref 8.9–10.3)
Chloride: 110 mmol/L (ref 98–111)
Chloride: 108 mmol/L (ref 98–111)
Creatinine, Ser: 1.28 mg/dL — ABNORMAL HIGH (ref 0.61–1.24)
Creatinine, Ser: 1.21 mg/dL (ref 0.61–1.24)
GFR, Estimated: 56 mL/min — ABNORMAL LOW (ref >60)
GFR, Estimated: 59 mL/min — ABNORMAL LOW (ref >60)
Glucose, Bld: 121 mg/dL — ABNORMAL HIGH (ref 70–99)
Glucose, Bld: 115 mg/dL — ABNORMAL HIGH (ref 70–99)
Potassium: 3.7 mmol/L (ref 3.5–5.1)
Potassium: 4.1 mmol/L (ref 3.5–5.1)
Sodium: 141 mmol/L (ref 135–145)
Sodium: 143 mmol/L (ref 135–145)
Total Bilirubin: 2.3 mg/dL — ABNORMAL HIGH (ref 0.0–1.2)
Total Bilirubin: 2 mg/dL — ABNORMAL HIGH (ref 0.0–1.2)
Total Protein: 7.1 g/dL (ref 6.5–8.1)
Total Protein: 7.2 g/dL (ref 6.5–8.1)

## 2023-12-09 LAB — CBC
HCT: 28.2 % — ABNORMAL LOW (ref 39.0–52.0)
Hemoglobin: 8.6 g/dL — ABNORMAL LOW (ref 13.0–17.0)
MCH: 28.7 pg (ref 26.0–34.0)
MCHC: 30.5 g/dL (ref 30.0–36.0)
MCV: 94 fL (ref 80.0–100.0)
Platelets: 267 K/uL (ref 150–400)
RBC: 3 MIL/uL — ABNORMAL LOW (ref 4.22–5.81)
RDW: 16.6 % — ABNORMAL HIGH (ref 11.5–15.5)
WBC: 5.8 K/uL (ref 4.0–10.5)
nRBC: 0 % (ref 0.0–0.2)

## 2023-12-09 LAB — PROTIME-INR
INR: 1.4 — ABNORMAL HIGH (ref 0.8–1.2)
Prothrombin Time: 18 s — ABNORMAL HIGH (ref 11.4–15.2)

## 2023-12-09 LAB — TYPE AND SCREEN
ABO/RH(D): A NEG
Antibody Screen: NEGATIVE

## 2023-12-09 LAB — PHOSPHORUS: Phosphorus: 2.4 mg/dL — ABNORMAL LOW (ref 2.5–4.6)

## 2023-12-09 LAB — IRON AND TIBC
Iron: 98 ug/dL (ref 45–182)
Saturation Ratios: 38 % (ref 17.9–39.5)
TIBC: 258 ug/dL (ref 250–450)
UIBC: 160 ug/dL

## 2023-12-09 LAB — FERRITIN: Ferritin: 4048 ng/mL — ABNORMAL HIGH (ref 24–336)

## 2023-12-09 LAB — ABO/RH: ABO/RH(D): A NEG

## 2023-12-09 LAB — GLUCOSE, CAPILLARY: Glucose-Capillary: 122 mg/dL — ABNORMAL HIGH (ref 70–99)

## 2023-12-09 LAB — CK: Total CK: 278 U/L (ref 49–397)

## 2023-12-09 LAB — APTT: aPTT: 35 s (ref 24–36)

## 2023-12-09 SURGERY — TEMPORARY PACEMAKER
Anesthesia: LOCAL

## 2023-12-09 MED ORDER — IOHEXOL 350 MG/ML SOLN
75.0000 mL | Freq: Once | INTRAVENOUS | Status: AC | PRN
  Administered 2023-12-09: 75 mL via INTRAVENOUS

## 2023-12-09 MED ORDER — ACETAMINOPHEN 325 MG PO TABS: 650.0000 mg | ORAL_TABLET | Freq: Four times a day (QID) | ORAL | Status: DC | PRN

## 2023-12-09 MED ORDER — ATROPINE SULFATE 1 MG/10ML IJ SOSY
1.0000 mg | PREFILLED_SYRINGE | Freq: Once | INTRAMUSCULAR | Status: AC
  Administered 2023-12-09: 1 mg via INTRAVENOUS
  Filled 2023-12-09: qty 10

## 2023-12-09 MED ORDER — LIDOCAINE HCL (PF) 1 % IJ SOLN
INTRAMUSCULAR | Status: DC | PRN
  Administered 2023-12-09: 2 mL via INTRADERMAL

## 2023-12-09 MED ORDER — LIDOCAINE HCL (PF) 1 % IJ SOLN
INTRAMUSCULAR | Status: AC
  Filled 2023-12-09: qty 30

## 2023-12-09 MED ORDER — PANTOPRAZOLE SODIUM 40 MG IV SOLR
40.0000 mg | Freq: Once | INTRAVENOUS | Status: AC
  Administered 2023-12-09: 40 mg via INTRAVENOUS
  Filled 2023-12-09: qty 10

## 2023-12-09 MED ORDER — SODIUM CHLORIDE 0.9% FLUSH
3.0000 mL | Freq: Two times a day (BID) | INTRAVENOUS | Status: DC
  Administered 2023-12-10 – 2023-12-16 (×12): 3 mL via INTRAVENOUS

## 2023-12-09 MED ORDER — PANTOPRAZOLE SODIUM 40 MG IV SOLR
40.0000 mg | Freq: Two times a day (BID) | INTRAVENOUS | Status: DC
  Administered 2023-12-10 – 2023-12-12 (×4): 40 mg via INTRAVENOUS
  Filled 2023-12-09 (×4): qty 10

## 2023-12-09 MED ORDER — MAGNESIUM SULFATE 2 GM/50ML IV SOLN: 2.0000 g | Freq: Once | INTRAVENOUS | Status: DC

## 2023-12-09 MED ORDER — ACETAMINOPHEN 650 MG RE SUPP: 650.0000 mg | Freq: Four times a day (QID) | RECTAL | Status: DC | PRN

## 2023-12-09 SURGICAL SUPPLY — 6 items
KIT MICROPUNCTURE NIT STIFF (SHEATH) IMPLANT
PACK CARDIAC CATHETERIZATION (CUSTOM PROCEDURE TRAY) ×1 IMPLANT
SHEATH PINNACLE 6F 10CM (SHEATH) IMPLANT
SHEATH PROBE COVER 6X72 (BAG) IMPLANT
SHIELD CATH-GARD CONTAMINATION (MISCELLANEOUS) IMPLANT
WIRE PACING TEMP ST TIP 5 (CATHETERS) IMPLANT

## 2023-12-09 NOTE — Progress Notes (Signed)
 Chart updated to reflect family members to call for decision making, consent, and questions. Patient has two biological daughters, Manuel Vargas and Manuel Vargas (both live out of town), one step daughter who is [place], and one Manuel Vargas, who lives in [place] and was present shortly after admission. Patient's wife Manuel Vargas is also elderly and sick and unable to make decisions for Manuel Vargas. Arrangements were made prior to this admission for both Manuel Vargas and Manuel Vargas to move to [place] in two weeks to an adult nursing facility with 24 hour care and memory care.

## 2023-12-09 NOTE — ED Notes (Signed)
 Daughter Bowe Sidor [phone] would like an update asap

## 2023-12-09 NOTE — Progress Notes (Addendum)
 Called by CCU nurses for vfib Patient admitted with PPM at [facility].  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  Discussed with patient who as lucid but with dementia Spoke with son in law Unable to get in touch with daughter Son in law was with patient on admission and is ok with TMP Wire tonight to pace at higher rate and prevent salvos of vfib Have called Dr [place] interventional who agrees and will come In to place TMP.   Exam with elderly male Non functioning ST PPM left clavicle Lungs clear No murmur No edema  K 3.3 , Mg 1.9  Troponin 75 no acute ST changes on spontaneous beats MS stable and BP 165/80 mmHg  Plan TMP tonight  EP to see in am for PPM Monitor in CCU post cath  Total time spent at bedside, reviewing chart examining patient Discussing with family and calling interventional lab Dr [place] in for TMP was 45 minutes   Maude Emmer MD [facility]

## 2023-12-09 NOTE — ED Provider Notes (Signed)
 [facility] AT [facility] Provider Note   CSN: [id number] Arrival date & time: 12/09/23  1504     Patient presents with: Bradycardia   Manuel Vargas is a 83 y.o. male.  3 of permanent pacemaker, atrial fibrillation status post ablation, mitral valve replacement, VSD, hypertension, AAA, type 2 diabetes and dementia who presents to the ED given concern for bradycardia.  EMS was initially called to patient's house after his wife fell and was on the floor.  EMS noted him to have an unresponsive episode which was transient and then vomit black emesis.  He was noted to be bradycardic with heart rate in the 30s with sporadic capture of pacer on EMS EKGs.  Blood pressure has been stable.  The patient himself is unable to recall the events leading up to today's ED visit.  He denies chest pain shortness of breath recent illness and other systemic complaints at this time.  History limited secondary to dementia   HPI     Prior to Admission medications   Medication Sig Start Date End Date Taking? Authorizing Provider  potassium chloride  (KLOR-CON  M) 10 MEQ tablet Take 1 tablet (10 mEq total) by mouth every other day. 07/30/23   Paz, Jose E, MD    Allergies: Patient has no known allergies.    Review of Systems  Updated Vital Signs BP (!) 193/64   Pulse (!) 35   Temp (!) 96.9 F (36.1 C) (Axillary)   Resp 17   Ht 5' 10 (1.778 m)   Wt 77.1 kg   SpO2 100%   BMI 24.39 kg/m   Physical Exam Vitals and nursing note reviewed.  HENT:     Head: Normocephalic and atraumatic.  Eyes:     Pupils: Pupils are equal, round, and reactive to light.  Cardiovascular:     Rate and Rhythm: Regular rhythm. Bradycardia present.  Pulmonary:     Effort: Pulmonary effort is normal.     Breath sounds: Normal breath sounds.  Abdominal:     Palpations: Abdomen is soft.     Tenderness: There is no abdominal tenderness.  Musculoskeletal:     Cervical back: Neck supple. No  tenderness.     Comments: 5 out of 5 motor strength bilateral upper and lower extremities No midline tenderness step-off deformity of back No evidence of trauma to extremities Sensation tact light touch throughout all 4 extremities  Skin:    General: Skin is warm and dry.  Neurological:     Mental Status: He is alert.     Sensory: No sensory deficit.     Motor: No weakness.     Comments: Oriented to self only  Psychiatric:        Mood and Affect: Mood normal.     (all labs ordered are listed, but only abnormal results are displayed) Labs Reviewed  COMPREHENSIVE METABOLIC PANEL WITH GFR - Abnormal; Notable for the following components:      Result Value   CO2 20 (*)    Glucose, Bld 121 (*)    Creatinine, Ser 1.28 (*)    Albumin 2.9 (*)    AST 106 (*)    ALT 53 (*)    Total Bilirubin 2.3 (*)    GFR, Estimated 56 (*)    All other components within normal limits  CBC WITH DIFFERENTIAL/PLATELET - Abnormal; Notable for the following components:   RBC 2.72 (*)    Hemoglobin 8.0 (*)    HCT 25.5 (*)  RDW 16.3 (*)    All other components within normal limits  PROTIME-INR - Abnormal; Notable for the following components:   Prothrombin Time 18.0 (*)    INR 1.4 (*)    All other components within normal limits  TROPONIN I (HIGH SENSITIVITY) - Abnormal; Notable for the following components:   Troponin I (High Sensitivity) 69 (*)    All other components within normal limits  TROPONIN I (HIGH SENSITIVITY) - Abnormal; Notable for the following components:   Troponin I (High Sensitivity) 75 (*)    All other components within normal limits  CK  MAGNESIUM   APTT  COMPREHENSIVE METABOLIC PANEL WITH GFR  CBC  CBC  POC OCCULT BLOOD, ED  TYPE AND SCREEN  ABO/RH    EKG: EKG Interpretation Date/Time:  Monday December 09 2023 16:00:28 EDT Ventricular Rate:  35 PR Interval:    QRS Duration:  113 QT Interval:  681 QTC Calculation: 520 R Axis:   104  Text Interpretation: Junctional  rhythm Probable lateral infarct, age indeterminate Abnrm T, consider ischemia, anterolateral lds Prolonged QT interval Confirmed by Pamella Sharper [phone]) on 12/09/2023 4:29:12 PM  Radiology: CT Head Wo Contrast Result Date: 12/09/2023 CLINICAL DATA:  Neck trauma (Age >= 65y); Head trauma, minor (Age >= 65y) EXAM: CT HEAD WITHOUT CONTRAST CT CERVICAL SPINE WITHOUT CONTRAST TECHNIQUE: Multidetector CT imaging of the head and cervical spine was performed following the standard protocol without intravenous contrast. Multiplanar CT image reconstructions of the cervical spine were also generated. RADIATION DOSE REDUCTION: This exam was performed according to the departmental dose-optimization program which includes automated exposure control, adjustment of the mA and/or kV according to patient size and/or use of iterative reconstruction technique. COMPARISON:  None Available. FINDINGS: CT HEAD FINDINGS Brain: Cerebral ventricle sizes are concordant with the degree of cerebral volume loss. Patchy and confluent areas of decreased attenuation are noted throughout the deep and periventricular white matter of the cerebral hemispheres bilaterally, compatible with chronic microvascular ischemic disease. No evidence of large-territorial acute infarction. No parenchymal hemorrhage. No mass lesion. No extra-axial collection. No mass effect or midline shift. No hydrocephalus. Basilar cisterns are patent. Vascular: No hyperdense vessel. Atherosclerotic calcifications are present within the cavernous internal carotid arteries. Skull: No acute fracture or focal lesion. Sinuses/Orbits: Paranasal sinuses and mastoid air cells are clear. The orbits are unremarkable. Other: None. CT CERVICAL SPINE FINDINGS Alignment: Normal. Skull base and vertebrae: Multilevel severe degenerative changes spine most prominent at the C5-C7 levels. No associated severe osseous neural foraminal or central canal stenosis. No acute fracture. No aggressive  appearing focal osseous lesion or focal pathologic process. Soft tissues and spinal canal: No prevertebral fluid or swelling. No visible canal hematoma. Upper chest: Severe centrilobular and paraseptal emphysematous changes. At least trace right pleural effusion. Other: Atherosclerotic plaque. IMPRESSION: 1. No acute intracranial abnormality. 2. No acute displaced fracture or traumatic listhesis of the cervical spine. 3. Aortic Atherosclerosis (ICD10-I70.0) and Emphysema (ICD10-J43.9). Electronically Signed   By: Morgane  Naveau M.D.   On: 12/09/2023 17:57   CT Cervical Spine Wo Contrast Result Date: 12/09/2023 CLINICAL DATA:  Neck trauma (Age >= 65y); Head trauma, minor (Age >= 65y) EXAM: CT HEAD WITHOUT CONTRAST CT CERVICAL SPINE WITHOUT CONTRAST TECHNIQUE: Multidetector CT imaging of the head and cervical spine was performed following the standard protocol without intravenous contrast. Multiplanar CT image reconstructions of the cervical spine were also generated. RADIATION DOSE REDUCTION: This exam was performed according to the departmental dose-optimization program which includes automated exposure control,  adjustment of the mA and/or kV according to patient size and/or use of iterative reconstruction technique. COMPARISON:  None Available. FINDINGS: CT HEAD FINDINGS Brain: Cerebral ventricle sizes are concordant with the degree of cerebral volume loss. Patchy and confluent areas of decreased attenuation are noted throughout the deep and periventricular white matter of the cerebral hemispheres bilaterally, compatible with chronic microvascular ischemic disease. No evidence of large-territorial acute infarction. No parenchymal hemorrhage. No mass lesion. No extra-axial collection. No mass effect or midline shift. No hydrocephalus. Basilar cisterns are patent. Vascular: No hyperdense vessel. Atherosclerotic calcifications are present within the cavernous internal carotid arteries. Skull: No acute fracture or  focal lesion. Sinuses/Orbits: Paranasal sinuses and mastoid air cells are clear. The orbits are unremarkable. Other: None. CT CERVICAL SPINE FINDINGS Alignment: Normal. Skull base and vertebrae: Multilevel severe degenerative changes spine most prominent at the C5-C7 levels. No associated severe osseous neural foraminal or central canal stenosis. No acute fracture. No aggressive appearing focal osseous lesion or focal pathologic process. Soft tissues and spinal canal: No prevertebral fluid or swelling. No visible canal hematoma. Upper chest: Severe centrilobular and paraseptal emphysematous changes. At least trace right pleural effusion. Other: Atherosclerotic plaque. IMPRESSION: 1. No acute intracranial abnormality. 2. No acute displaced fracture or traumatic listhesis of the cervical spine. 3. Aortic Atherosclerosis (ICD10-I70.0) and Emphysema (ICD10-J43.9). Electronically Signed   By: Morgane  Naveau M.D.   On: 12/09/2023 17:57   CT Angio Abd/Pel W and/or Wo Contrast Result Date: 12/09/2023 CLINICAL DATA:  Abdominal aortic aneurysm (AAA), follow up EXAM: CTA ABDOMEN AND PELVIS WITHOUT AND WITH CONTRAST TECHNIQUE: Multidetector CT imaging of the abdomen and pelvis was performed using the standard protocol during bolus administration of intravenous contrast. Multiplanar reconstructed images and MIPs were obtained and reviewed to evaluate the vascular anatomy. RADIATION DOSE REDUCTION: This exam was performed according to the departmental dose-optimization program which includes automated exposure control, adjustment of the mA and/or kV according to patient size and/or use of iterative reconstruction technique. CONTRAST:  75mL OMNIPAQUE  IOHEXOL  350 MG/ML SOLN COMPARISON:  None Available. 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 Mass effect onto the IVC noted. No fistulization with  the IVC identified. No dissection identified. The aneurysm terminates 1 cm before the aortic bifurcation. No periaortic fat stranding. No stenosis. Severe atherosclerotic plaque. No draping sign of the aorta along the lumbar spine. Celiac: Moderate atherosclerotic plaque. Patent without evidence of aneurysm, dissection, vasculitis or significant stenosis. SMA: Moderate atherosclerotic plaque. Patent without evidence of aneurysm, dissection, vasculitis or significant stenosis. Renals: Moderate severe atherosclerotic plaque. Both renal arteries are patent without evidence of aneurysm, dissection, vasculitis, fibromuscular dysplasia or significant stenosis. IMA: Patent without evidence of aneurysm, dissection, vasculitis or significant stenosis. Inflow: Moderate severe atherosclerotic plaque. Patent without evidence of aneurysm, dissection, vasculitis or significant stenosis. Proximal Outflow: Severe atherosclerotic plaque. Right common femoral artery stent with evaluation of patency limited due to timing of contrast. Otherwise bilateral common femoral and visualized portions of the superficial and profunda femoral arteries are patent without evidence of aneurysm, dissection, vasculitis or significant stenosis. Veins: Retrograde reflux of contrast into the inferior vena cava and hepatic veins. Otherwise no obvious venous abnormality within the limitations of this arterial phase study. Review of the MIP images confirms the above findings. NON-VASCULAR Lower chest: Small hiatal hernia. Cardiomegaly. Cardiac leads partially visualized. Mitral annular calcification. Aortic valve leaflet calcification. Coronary artery calcification. Bilateral  small pleural effusions. Passive atelectasis of the lower lung zones. Hepatobiliary: No focal liver abnormality. Question calcified gallstones within the gallbladder lumen. Findings suggestive of associated gallbladder wall thickening. No pericholecystic fluid. No biliary dilatation.  Pancreas: No focal lesion. Normal pancreatic contour. No surrounding inflammatory changes. No main pancreatic ductal dilatation. Spleen: Normal in size without focal abnormality. Adrenals/Urinary Tract: No adrenal nodule bilaterally. Bilateral kidneys enhance symmetrically. No hydronephrosis. No hydroureter. The urinary bladder is unremarkable. Stomach/Bowel: Stomach is within normal limits. No evidence of bowel wall thickening or dilatation. Appendix appears normal. Lymphatic: No lymphadenopathy. Reproductive: Prostate is unremarkable. Other: No intraperitoneal free fluid. No intraperitoneal free gas. No organized fluid collection. Musculoskeletal: No abdominal wall hernia.  Diffuse subcutaneus soft tissue edema. No suspicious lytic or blastic osseous lesions. No acute displaced fracture. Multilevel degenerative changes of the spine. IMPRESSION: VASCULAR 1. 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 Associated mass effect onto the IVC with no visualization identified. Recommend referral to or continued care with vascular specialist. (Ref.: J Vasc Surg. 2018; 67:2-77 and J Am Coll Radiol 2013;10(10):789-794.) 2. Aortic Atherosclerosis (ICD10-I70.0) including coronary artery, mitral annular, aortic valve leaflet calcifications-correlate for aortic stenosis. 3. Right common femoral artery stent with evaluation of patency limited due to timing of contrast. NON-VASCULAR 1. Question cholelithiasis. Nonspecific gallbladder wall thickening. Correlate with liver function test and consider right upper quadrant ultrasound for a more sensitive evaluation of the gallbladder 2. Colonic diverticulosis with no acute diverticulitis. 3. Small hiatal hernia. 4. Bilateral small pleural effusions. 5. Cardiomegaly. Electronically Signed   By: Morgane  Naveau M.D.   On: 12/09/2023 17:53   DG Chest Portable 1 View Result Date:  12/09/2023 CLINICAL DATA:  Syncope. EXAM: PORTABLE CHEST 1 VIEW COMPARISON:  Chest radiograph dated 08/04/2022. FINDINGS: There is cardiomegaly with mild vascular congestion. Faint bilateral interstitial densities may represent edema or atypical pneumonia. Small left pleural effusion and left lung base atelectasis or infiltrate. No pneumothorax. Stable cardiomegaly. Median sternotomy wires and CABG vascular clips. Left pectoral pacemaker device. No acute osseous pathology. IMPRESSION: 1. Cardiomegaly with mild vascular congestion. 2. Small left pleural effusion and left lung base atelectasis or infiltrate. Electronically Signed   By: Vanetta Chou M.D.   On: 12/09/2023 15:41     .Critical Care  Performed by: Pamella Ozell LABOR, DO Authorized by: Pamella Ozell LABOR, DO   Critical care provider statement:    Critical care time (minutes):  80   Critical care time was exclusive of:  Separately billable procedures and treating other patients and teaching time   Critical care was necessary to treat or prevent imminent or life-threatening deterioration of the following conditions:  Cardiac failure   Critical care was time spent personally by me on the following activities:  Development of treatment plan with patient or surrogate, discussions with consultants, evaluation of patient's response to treatment, examination of patient, ordering and review of laboratory studies, ordering and review of radiographic studies, ordering and performing treatments and interventions, pulse oximetry, re-evaluation of patient's condition, review of old charts and obtaining history from patient or surrogate   I assumed direction of critical care for this patient from another provider in my specialty: no     Care discussed with: admitting provider   Comments:     Discussed with Dr. Legrand (GI), Dr. Lonni (cardiology) and Dr. Seena (hospitalist)    Medications Ordered in the ED  sodium chloride  flush (  NS) 0.9 %  injection 3 mL (has no administration in time range)  acetaminophen  (TYLENOL ) tablet 650 mg (has no administration in time range)    Or  acetaminophen  (TYLENOL ) suppository 650 mg (has no administration in time range)  pantoprazole  (PROTONIX ) injection 40 mg (has no administration in time range)  atropine  1 MG/10ML injection 1 mg (1 mg Intravenous Given 12/09/23 1526)  iohexol  (OMNIPAQUE ) 350 MG/ML injection 75 mL (75 mLs Intravenous Contrast Given 12/09/23 1730)  pantoprazole  (PROTONIX ) injection 40 mg (40 mg Intravenous Given 12/09/23 1823)    Clinical Course as of 12/09/23 1856  Mon Dec 09, 2023  1634 Discussed with cardiology NP Trish who will evaluate patient in the ED. [MP]  1650 Patient had brief episode of nonsustained ventricular tachycardia on the monitor while I was talking to him at this time.  He remained fully alert and conversational with no chest pain or other symptoms. [MP]  1724 Discussed with admitting hospitalist Dr. Seena who will admit patient to medicine service but will await cardiology evaluation to ascertain appropriate level of service for admission [MP]  1807 Pacemaker has been interrogated.  No capture.  Will likely require new PPM [MP]  1840 Reviewed CTs.  No traumatic findings.  Interval increase of known AAA.  No evidence of rupture at this time.  Admitting team is aware [MP]  1852 Dr. Lonni (cardiology) has evaluated patient in the ED.  Will admit to ICU under hospitalist service with plan for EP evaluation in the morning.  Patient remains bradycardic and hypertensive at this time [MP]    Clinical Course User Index [MP] Pamella Ozell LABOR, DO                                 Medical Decision Making 83 year old male with history as above presenting via EMS given concern for syncopal episode at home with reported black emesis.  Bradycardia in the 30s with sporadic capture of permanent pacemaker.  Blood pressure has been stable.  No appreciable evidence of  trauma but given concern for potential fall and history of dementia will obtain CT head and C-spine.  Will obtain cardiac workup and continue to monitor on telemetry with external pacer pads in place should he become unstable.  Also concern for acute GI bleeding.  Will obtain labs along with type and screen and provide blood transfusion as indicated.  Will reach out to cardiology regarding bradycardia with potential pacemaker malfunction.  Will plan for admission  Amount and/or Complexity of Data Reviewed Labs: ordered. Radiology: ordered.  Risk Prescription drug management. Decision regarding hospitalization.        Final diagnoses:  Bradycardia  Nonsustained ventricular tachycardia (HCC)  Gastrointestinal hemorrhage, unspecified gastrointestinal hemorrhage type  Syncope, unspecified syncope type    ED Discharge Orders     None          Pamella Ozell LABOR, DO 12/09/23 1856

## 2023-12-09 NOTE — Progress Notes (Signed)
 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 EMS called due to syncopal episode with subsequent emesis of black material as reported by wife. He was noted to be bradycardic. On PPM interrogation pacemaker not functioning appropriately,. H/H 8/25.5.  Head and C spine CT negative for acute process, chest CT with emphysema, abdominal CT with 5.9 x 5.1 cm AAA, diverticulosis.  eICU Interventions  Symptomatic bradycardia, seen by cardiology, plan for device interrogation in AM. Anemia appears chronic but H/H lower than previous. Will get iron studies, monitor H/H. AAA being monitored in [facility], will need to retrieve records.     Intervention Category Evaluation Type: New Patient Evaluation  Damien ONEIDA Grout 12/09/2023, 7:06 PM

## 2023-12-09 NOTE — Progress Notes (Signed)
 eLink Physician-Brief Progress Note Patient Name: Manuel Vargas DOB: [birth year] MRN: [id number]   Date of Service  12/09/2023  HPI/Events of Note  Notified of run of [place], cardiology coming to bedside to place TVP due to malfunction PPM,   Mag 1.9 at 3:12pm with K 3.7   PIV   NPO  eICU Interventions  Ordered magnesium  2 g IVPB Add on Phos level Creatinine 1.28     Intervention Category Intermediate Interventions: Arrhythmia - evaluation and management  Damien ONEIDA Grout 12/09/2023, 7:50 PM

## 2023-12-09 NOTE — Progress Notes (Signed)
 This RN called from ED RN Alica) and will give bedside report upon arrival to the unit.

## 2023-12-09 NOTE — ED Notes (Signed)
 CCMD Called

## 2023-12-09 NOTE — H&P (Signed)
 History and Physical   Manuel Vargas FMW:[id number] DOB: [birth year] DOA: 12/09/2023  PCP: Manuel Vargas, Manuel Vargas   Patient coming from: Home  Chief Complaint: Bradycardia, syncope, hematemesis  HPI: Manuel Vargas is a 83 y.o. male with medical history significant of diabetes, atrial fibrillation, CAD, GERD, PAD, postrepair, dementia, AAA steatosis, status post pacemaker presenting with bradycardia, status.  EMS was initially called out to patient's house for the patient's wife to [facility] for assistance getting up.  This was there patient had a syncopal episode working with consciousness was helped to the ground by EMS.  This was followed by an episode of black vomitus.  He was noted to be bradycardic in the 30s with only sporadic pacemaker capture per EMS.  Blood pressure remained stable.  Patient denies fevers, chills, chest pain, shortness of breath, abdominal pain, constipation, diarrhea.   Recent notes indicate patient recently reestablished with a PCP earlier this year in February.  Appears at that time he stated he was off all medications for the past year.  Remains off medications.  ED Course: Rate in the 30s.  Blood pressure in the 150s-180s systolic.  Lab workup included CMP with sodium 141 potassium 3.7, bicarb 20, creatinine 1.2 baseline 0.9, glucose 121, albumin ALT 53, T. bili 2.3.  Notable for hemoglobin of 8.2 down from baseline of 10-11.  Mildly elevated to eighteen 1.4 respectively.  FOBT negative.  Troponin 69 with repeat pending.  CK normal.  Type and screen performed.  Normal.  EKG showed junctional rhythm at 35 bpm, T wave inversions, QTc.  Chest x-ray showed cardiomegaly with mild left base atelectasis versus infiltrate.  Patient received 1 dose of atropine  in the ED cardiology and gastroenterology consulted who will see the patient.  Review of Systems: As per HPI otherwise all other systems reviewed and are negative.  Past Medical History:  Diagnosis Date   AAA (abdominal  aortic aneurysm) (HCC)    Anemia    Atrial fibrillation (HCC)    Blood transfusion without reported diagnosis    CAD (coronary artery disease)    Dementia (HCC)    Diverticulosis of colon    GERD (gastroesophageal reflux disease)    Hepatic steatosis    History of malaria    Hyperlipidemia    Hypertension    Type 2 diabetes mellitus (HCC)    VSD (ventricular septal defect)     Past Surgical History:  Procedure Laterality Date   ATRIAL FIBRILLATION ABLATION  2013   CORONARY ANGIOGRAM  2013   w/ angioplasty   CORONARY ANGIOGRAM  2010   CORONARY ARTERY BYPASS GRAFT  1993   x2   ILIAC ARTERY STENT  2005   LOWER EXTREMITY ANGIOGRAM Right 2017   PACEMAKER INSERTION  2013   PTCA  2015   multiple procedures last in 2015   VSD REPAIR  1974    Social History  reports that he has quit smoking. His smoking use included cigarettes. He has never used smokeless tobacco. He reports current alcohol  use of about 7.0 - 9.0 standard drinks of alcohol  per week. He reports that he does not use drugs.  No Known Allergies  Family History  Family history unknown: Yes  Reviewed on admission  Prior to Admission medications   Medication Sig Start Date End Date Taking? Authorizing Provider  potassium chloride  (KLOR-CON  M) 10 MEQ tablet Take 1 tablet (10 mEq total) by mouth every other day. 07/30/23   Manuel Vargas, Manuel Vargas    Physical Exam: Vitals:  12/09/23 1615 12/09/23 1620 12/09/23 1713 12/09/23 1714  BP:  (!) 181/64  (!) 174/72  Pulse: (!) 35 (!) 35  (!) 38  Resp: 17 17  11   Temp:   (!) 96.9 F (36.1 C)   TempSrc:   Axillary   SpO2: 100% 100%  100%  Weight:      Height:        Physical Exam Constitutional:      General: He is not in acute distress.    Appearance: Normal appearance.  HENT:     Head: Normocephalic and atraumatic.     Mouth/Throat:     Mouth: Mucous membranes are moist.     Pharynx: Oropharynx is clear.  Eyes:     Extraocular Movements: Extraocular movements  intact.     Pupils: Pupils are equal, round, and reactive to light.  Cardiovascular:     Rate and Rhythm: Regular rhythm. Bradycardia present.     Pulses: Normal pulses.     Heart sounds: Normal heart sounds.  Pulmonary:     Effort: Pulmonary effort is normal. No respiratory distress.     Breath sounds: Normal breath sounds.  Abdominal:     General: Bowel sounds are normal. There is no distension.     Palpations: Abdomen is soft.     Tenderness: There is no abdominal tenderness.  Musculoskeletal:        General: No swelling or deformity.  Skin:    General: Skin is warm and dry.  Neurological:     General: No focal deficit present.     Mental Status: Mental status is at baseline.    Labs on Admission: I have personally reviewed following labs and imaging studies  CBC: Recent Labs  Lab 12/09/23 1512  WBC 5.0  NEUTROABS 4.0  HGB 8.0*  HCT 25.5*  MCV 93.8  PLT 256    Basic Metabolic Panel: Recent Labs  Lab 12/09/23 1512  NA 141  K 3.7  CL 110  CO2 20*  GLUCOSE 121*  BUN 18  CREATININE 1.28*  CALCIUM 9.5  MG 1.9    GFR: Estimated Creatinine Clearance: 45.1 mL/min (A) (by C-G formula based on SCr of 1.28 mg/dL (H)).  Liver Function Tests: Recent Labs  Lab 12/09/23 1512  AST 106*  ALT 53*  ALKPHOS 70  BILITOT 2.3*  PROT 7.1  ALBUMIN 2.9*    Urine analysis:    Component Value Date/Time   COLORURINE STRAW (A) 01/15/2022 0330   APPEARANCEUR CLEAR 01/15/2022 0330   LABSPEC 1.003 (L) 01/15/2022 0330   PHURINE 6.0 01/15/2022 0330   GLUCOSEU NEGATIVE 01/15/2022 0330   HGBUR NEGATIVE 01/15/2022 0330   BILIRUBINUR NEGATIVE 01/15/2022 0330   KETONESUR NEGATIVE 01/15/2022 0330   PROTEINUR NEGATIVE 01/15/2022 0330   NITRITE NEGATIVE 01/15/2022 0330   LEUKOCYTESUR NEGATIVE 01/15/2022 0330    Radiological Exams on Admission: DG Chest Portable 1 View Result Date: 12/09/2023 CLINICAL DATA:  Syncope. EXAM: PORTABLE CHEST 1 VIEW COMPARISON:  Chest radiograph  dated 08/04/2022. FINDINGS: There is cardiomegaly with mild vascular congestion. Faint bilateral interstitial densities may represent edema or atypical pneumonia. Small left pleural effusion and left lung base atelectasis or infiltrate. No pneumothorax. Stable cardiomegaly. Median sternotomy wires and CABG vascular clips. Left pectoral pacemaker device. No acute osseous pathology. IMPRESSION: 1. Cardiomegaly with mild vascular congestion. 2. Small left pleural effusion and left lung base atelectasis or infiltrate. Electronically Signed   By: Vanetta Chou M.D.   On: 12/09/2023 15:41   EKG:  Independently reviewed.  Junctional rhythm at 35 bpm.  T wave inversions.  QT prolongation at 520.  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  Manuel Vargas bradycardic in the 30s which has been persistent. > Patient is status post pacemaker presumably due to bradycardia given his presentation.  The occasional pacemaker capture, presumed pacemaker malfunction. > Heart rate remains in the 30s in the ED.  Blood pressure is okay and patient is largely asymptomatic at this time.  Cardiology has been consulted, await recommendations prior to bed request. - Anticipate admission to ICU versus progressive unit - Appreciate cardiology recommendations and assistance - Echocardiogram  - Potassium repletion  GI bleed Anemia Hematemesis > Patient had dark vomiting episode following his syncopal event earlier today. > FOBT is negative, though this may be lagging  behind.  CBC does show hemoglobin of 8.0 down from baseline of 10-11. > GI consulted in the ED - Appreciate GI recommendations and assistance - Trend CBC - Transfuse for hemoglobin less than 7 or progressive decline/symptoms - N.p.o. for now  Diabetes Atrial fibrillation CAD GERD PAD - No longer taking medications   Dementia AAA Steatosis VSD s/p repair - Noted   DVT prophylaxis: SCDs Code Status:   Full  Family Communication:  Updated at bedside  Disposition Plan:   Patient is from:  Home  Anticipated DC to:  Home  Anticipated DC date:  2 to 5 days  Anticipated DC barriers: none  Consults called:  Cardiology gastroenterology Admission status:  Inpatient, progressive  Severity of Illness: The appropriate patient status for this patient is INPATIENT. Inpatient status is judged to be reasonable and necessary in order to provide the required intensity of service to ensure the patient's safety. The patient's presenting symptoms, physical exam findings, and initial radiographic and laboratory data in the context of their chronic comorbidities is felt to place them at high risk for further clinical deterioration. Furthermore, it is not anticipated that the patient will be medically stable for discharge from the hospital within 2 midnights of admission.   * I certify that at the point of admission it is my clinical judgment that the patient will require inpatient hospital care spanning beyond 2 midnights from the point of admission due to high intensity of service, high risk for further deterioration and high frequency of surveillance required.Manuel Vargas Manuel KATHEE Seena Manuel Vargas [facility]  How to contact the [facility] Attending or Consulting provider 7A - 7P or covering provider during after hours 7P -7A, for this patient?   Check the care team in [facility] and look for a) attending/consulting [facility] provider listed and b) the [facility] team listed Log into [email or web address] and use [place]'s universal  password to access. If you do not have the password, please contact the hospital operator. Locate the [facility] provider you are looking for under [facility] and page to a number that you can be directly reached. If you still have difficulty reaching the provider, please page the [facility] (Director on Call) for the Hospitalists listed on amion for assistance.  12/09/2023, 5:29 PM

## 2023-12-09 NOTE — Progress Notes (Signed)
 Pt son called and updated of plan all questions answered, support given.

## 2023-12-09 NOTE — ED Notes (Signed)
 Patient transported to CT

## 2023-12-09 NOTE — ED Triage Notes (Signed)
 Initially called out bc wife was on floor, pt then went unresponsive and fell into EMS arms and EMS placed pt on floor. Denies hitting head. Pt has pacemaker. Hx of dementia. HR in 30's. Denies CP. C/O black vomit.

## 2023-12-09 NOTE — Consult Note (Signed)
 Cardiology Consultation   Patient ID: Manuel Vargas MRN: [id number]; DOB: 10-Jul-1940  Admit date: 12/09/2023 Date of Consult: 12/09/2023  PCP:  Amon Aloysius BRAVO, MD   [facility] Providers Cardiologist:  None        Patient Profile: Manuel Vargas is a 83 y.o. male with a hx of permanent pacemaker, CAD, PAD, atrial fibrillation who is being seen 12/09/2023 for the evaluation of bradycardia at the request of Dr. Pamella.  History of Present Illness: Mr. Manuel Vargas had St Jude pacemaker implanted in [place] in 2017 but appears to have bene lost to follow up. He was referred to cardiology here, but according to referral notes, patient declined appointment and stated it was no longer needed.  There are scanned records from [facility] under the media tab (total of 949 pages of records). Noted to have persistent atrial fibrillation, CAD with prior stent, carotid bruit, severe PAD s/p PTA and atherectomy of R popliteal, hypertension, hyperlipidemia, type II diabetes, AAA, among other medical conditions.  He has dementia and is not a good historian. Per report, EMS was actually called to his house for his wife, but while EMS was there, patient had a syncopal episode and then emesis with black material. Notes to be bradycardic, brought to ER for further evaluation.  Patient endorses that he is taking no medications at home. He reports feeling fine currently. Cannot give me any of his history. When I discussed his pacemaker, he initially did not recall having one and then stated that it was just put in recently.  Device interrogation reviewed, ERI noted as today with notification of approaching end of life in 2023.   Past Medical History:  Diagnosis Date   AAA (abdominal aortic aneurysm) (HCC)    Anemia    Atrial fibrillation (HCC)    Blood transfusion without reported diagnosis    CAD (coronary artery disease)    Dementia (HCC)    Diverticulosis of colon    GERD (gastroesophageal  reflux disease)    Hepatic steatosis    History of malaria    Hyperlipidemia    Hypertension    Type 2 diabetes mellitus (HCC)    VSD (ventricular septal defect)     Past Surgical History:  Procedure Laterality Date   ATRIAL FIBRILLATION ABLATION  2013   CORONARY ANGIOGRAM  2013   w/ angioplasty   CORONARY ANGIOGRAM  2010   CORONARY ARTERY BYPASS GRAFT  1993   x2   ILIAC ARTERY STENT  2005   LOWER EXTREMITY ANGIOGRAM Right 2017   PACEMAKER INSERTION  2013   PTCA  2015   multiple procedures last in 2015   VSD REPAIR  1974     Home Medications:  Prior to Admission medications   Medication Sig Start Date End Date Taking? Authorizing Provider  potassium chloride  (KLOR-CON  M) 10 MEQ tablet Take 1 tablet (10 mEq total) by mouth every other day. 07/30/23   Paz, Manuel E, MD    Scheduled Meds:  pantoprazole  (PROTONIX ) IV  40 mg Intravenous Once   Continuous Infusions:  PRN Meds:   Allergies:   No Known Allergies  Social History:   Social History   Socioeconomic History   Marital status: Married    Spouse name: Not on file   Number of children: Not on file   Years of education: Not on file   Highest education level: Not on file  Occupational History   Occupation: retired; Comptroller  Tobacco Use   Smoking  status: Former    Types: Cigarettes   Smokeless tobacco: Never   Tobacco comments:    1 PPD x years, quit ~2009  Substance and Sexual Activity   Alcohol  use: Yes    Alcohol /week: 7.0 - 9.0 standard drinks of alcohol     Types: 5 - 6 Glasses of wine, 2 - 3 Shots of liquor per week    Comment: ETOH most day   Drug use: Never   Sexual activity: Not on file  Other Topics Concern   Not on file  Social History Narrative   Born in [place] from a [place] familiy   Moved to the [place]  in the 60s   Has 1 daughter    Social Drivers of Corporate investment banker Strain: Not on file  Food Insecurity: Not on file  Transportation Needs: Not on file  Physical  Activity: Not on file  Stress: Not on file  Social Connections: Not on file  Intimate Partner Violence: Not on file    Family History:    Family History  Family history unknown: Yes     ROS:  Limited 2/2 dementia  Physical Exam/Data: Vitals:   12/09/23 1615 12/09/23 1620 12/09/23 1713 12/09/23 1714  BP:  (!) 181/64  (!) 174/72  Pulse: (!) 35 (!) 35  (!) 38  Resp: 17 17  11   Temp:   (!) 96.9 F (36.1 C)   TempSrc:   Axillary   SpO2: 100% 100%  100%  Weight:      Height:       No intake or output data in the 24 hours ending 12/09/23 1802    12/09/2023    3:12 PM 07/26/2023    1:43 PM 08/04/2022    6:53 AM  Last 3 Weights  Weight (lbs) 170 lb 134 lb 6.4 oz 160 lb  Weight (kg) 77.111 kg 60.963 kg 72.576 kg     Body mass index is 24.39 kg/m.  General:  Well nourished, well developed, in no acute distress HEENT: normal Neck: no JVD Vascular: No carotid bruits; Distal pulses 2+ bilaterally Cardiac:  bradycardic, normal S1, S2; RRR; no murmur appreciated Lungs:  clear to auscultation bilaterally, no wheezing, rhonchi or rales  Abd: soft, nontender, no hepatomegaly  Ext: no edema Musculoskeletal:  No deformities, BUE and BLE strength normal and equal Skin: warm and dry  Neuro:  CNs 2-12 intact, no focal abnormalities noted Psych:  Normal affect   EKG:  The EKG was personally reviewed and demonstrates:  Junctional rhythm at 35 Telemetry:  Telemetry was personally reviewed and demonstrates:  junctional rhythm in the mid 30s  Relevant CV Studies: none  Laboratory Data: High Sensitivity Troponin:   Recent Labs  Lab 12/09/23 1512 12/09/23 1717  TROPONINIHS 69* 75*     Chemistry Recent Labs  Lab 12/09/23 1512  NA 141  K 3.7  CL 110  CO2 20*  GLUCOSE 121*  BUN 18  CREATININE 1.28*  CALCIUM 9.5  MG 1.9  GFRNONAA 56*  ANIONGAP 11    Recent Labs  Lab 12/09/23 1512  PROT 7.1  ALBUMIN 2.9*  AST 106*  ALT 53*  ALKPHOS 70  BILITOT 2.3*   Lipids No  results for input(s): CHOL, TRIG, HDL, LABVLDL, LDLCALC, CHOLHDL in the last 168 hours.  Hematology Recent Labs  Lab 12/09/23 1512  WBC 5.0  RBC 2.72*  HGB 8.0*  HCT 25.5*  MCV 93.8  MCH 29.4  MCHC 31.4  RDW 16.3*  PLT 256  Thyroid No results for input(s): TSH, FREET4 in the last 168 hours.  BNPNo results for input(s): BNP, PROBNP in the last 168 hours.  DDimer No results for input(s): DDIMER in the last 168 hours.  Radiology/Studies:  CT Head Wo Contrast Result Date: 12/09/2023 CLINICAL DATA:  Neck trauma (Age >= 65y); Head trauma, minor (Age >= 65y) EXAM: CT HEAD WITHOUT CONTRAST CT CERVICAL SPINE WITHOUT CONTRAST TECHNIQUE: Multidetector CT imaging of the head and cervical spine was performed following the standard protocol without intravenous contrast. Multiplanar CT image reconstructions of the cervical spine were also generated. RADIATION DOSE REDUCTION: This exam was performed according to the departmental dose-optimization program which includes automated exposure control, adjustment of the mA and/or kV according to patient size and/or use of iterative reconstruction technique. COMPARISON:  None Available. FINDINGS: CT HEAD FINDINGS Brain: Cerebral ventricle sizes are concordant with the degree of cerebral volume loss. Patchy and confluent areas of decreased attenuation are noted throughout the deep and periventricular white matter of the cerebral hemispheres bilaterally, compatible with chronic microvascular ischemic disease. No evidence of large-territorial acute infarction. No parenchymal hemorrhage. No mass lesion. No extra-axial collection. No mass effect or midline shift. No hydrocephalus. Basilar cisterns are patent. Vascular: No hyperdense vessel. Atherosclerotic calcifications are present within the cavernous internal carotid arteries. Skull: No acute fracture or focal lesion. Sinuses/Orbits: Paranasal sinuses and mastoid air cells are clear. The orbits  are unremarkable. Other: None. CT CERVICAL SPINE FINDINGS Alignment: Normal. Skull base and vertebrae: Multilevel severe degenerative changes spine most prominent at the C5-C7 levels. No associated severe osseous neural foraminal or central canal stenosis. No acute fracture. No aggressive appearing focal osseous lesion or focal pathologic process. Soft tissues and spinal canal: No prevertebral fluid or swelling. No visible canal hematoma. Upper chest: Severe centrilobular and paraseptal emphysematous changes. At least trace right pleural effusion. Other: Atherosclerotic plaque. IMPRESSION: 1. No acute intracranial abnormality. 2. No acute displaced fracture or traumatic listhesis of the cervical spine. 3. Aortic Atherosclerosis (ICD10-I70.0) and Emphysema (ICD10-J43.9). Electronically Signed   By: Morgane  Naveau M.D.   On: 12/09/2023 17:57   CT Cervical Spine Wo Contrast Result Date: 12/09/2023 CLINICAL DATA:  Neck trauma (Age >= 65y); Head trauma, minor (Age >= 65y) EXAM: CT HEAD WITHOUT CONTRAST CT CERVICAL SPINE WITHOUT CONTRAST TECHNIQUE: Multidetector CT imaging of the head and cervical spine was performed following the standard protocol without intravenous contrast. Multiplanar CT image reconstructions of the cervical spine were also generated. RADIATION DOSE REDUCTION: This exam was performed according to the departmental dose-optimization program which includes automated exposure control, adjustment of the mA and/or kV according to patient size and/or use of iterative reconstruction technique. COMPARISON:  None Available. FINDINGS: CT HEAD FINDINGS Brain: Cerebral ventricle sizes are concordant with the degree of cerebral volume loss. Patchy and confluent areas of decreased attenuation are noted throughout the deep and periventricular white matter of the cerebral hemispheres bilaterally, compatible with chronic microvascular ischemic disease. No evidence of large-territorial acute infarction. No  parenchymal hemorrhage. No mass lesion. No extra-axial collection. No mass effect or midline shift. No hydrocephalus. Basilar cisterns are patent. Vascular: No hyperdense vessel. Atherosclerotic calcifications are present within the cavernous internal carotid arteries. Skull: No acute fracture or focal lesion. Sinuses/Orbits: Paranasal sinuses and mastoid air cells are clear. The orbits are unremarkable. Other: None. CT CERVICAL SPINE FINDINGS Alignment: Normal. Skull base and vertebrae: Multilevel severe degenerative changes spine most prominent at the C5-C7 levels. No associated severe osseous neural foraminal or central canal  stenosis. No acute fracture. No aggressive appearing focal osseous lesion or focal pathologic process. Soft tissues and spinal canal: No prevertebral fluid or swelling. No visible canal hematoma. Upper chest: Severe centrilobular and paraseptal emphysematous changes. At least trace right pleural effusion. Other: Atherosclerotic plaque. IMPRESSION: 1. No acute intracranial abnormality. 2. No acute displaced fracture or traumatic listhesis of the cervical spine. 3. Aortic Atherosclerosis (ICD10-I70.0) and Emphysema (ICD10-J43.9). Electronically Signed   By: Morgane  Naveau M.D.   On: 12/09/2023 17:57   CT Angio Abd/Pel W and/or Wo Contrast Result Date: 12/09/2023 CLINICAL DATA:  Abdominal aortic aneurysm (AAA), follow up EXAM: CTA ABDOMEN AND PELVIS WITHOUT AND WITH CONTRAST TECHNIQUE: Multidetector CT imaging of the abdomen and pelvis was performed using the standard protocol during bolus administration of intravenous contrast. Multiplanar reconstructed images and MIPs were obtained and reviewed to evaluate the vascular anatomy. RADIATION DOSE REDUCTION: This exam was performed according to the departmental dose-optimization program which includes automated exposure control, adjustment of the mA and/or kV according to patient size and/or use of iterative reconstruction technique. CONTRAST:   75mL OMNIPAQUE  IOHEXOL  350 MG/ML SOLN COMPARISON:  None Available. 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 Mass effect onto the IVC noted. No fistulization with the IVC identified. No dissection identified. The aneurysm terminates 1 cm before the aortic bifurcation. No periaortic fat stranding. No stenosis. Severe atherosclerotic plaque. No draping sign of the aorta along the lumbar spine. Celiac: Moderate atherosclerotic plaque. Patent without evidence of aneurysm, dissection, vasculitis or significant stenosis. SMA: Moderate atherosclerotic plaque. Patent without evidence of aneurysm, dissection, vasculitis or significant stenosis. Renals: Moderate severe atherosclerotic plaque. Both renal arteries are patent without evidence of aneurysm, dissection, vasculitis, fibromuscular dysplasia or significant stenosis. IMA: Patent without evidence of aneurysm, dissection, vasculitis or significant stenosis. Inflow: Moderate severe atherosclerotic plaque. Patent without evidence of aneurysm, dissection, vasculitis or significant stenosis. Proximal Outflow: Severe atherosclerotic plaque. Right common femoral artery stent with evaluation of patency limited due to timing of contrast. Otherwise bilateral common femoral and visualized portions of the superficial and profunda femoral arteries are patent without evidence of aneurysm, dissection, vasculitis or significant stenosis. Veins: Retrograde reflux of contrast into the inferior vena cava and hepatic veins. Otherwise no obvious venous abnormality within the limitations of this arterial phase study. Review of the MIP images confirms the above findings. NON-VASCULAR Lower chest: Small hiatal hernia. Cardiomegaly. Cardiac leads partially visualized. Mitral annular calcification. Aortic valve leaflet calcification. Coronary artery  calcification. Bilateral small pleural effusions. Passive atelectasis of the lower lung zones. Hepatobiliary: No focal liver abnormality. Question calcified gallstones within the gallbladder lumen. Findings suggestive of associated gallbladder wall thickening. No pericholecystic fluid. No biliary dilatation. Pancreas: No focal lesion. Normal pancreatic contour. No surrounding inflammatory changes. No main pancreatic ductal dilatation. Spleen: Normal in size without focal abnormality. Adrenals/Urinary Tract: No adrenal nodule bilaterally. Bilateral kidneys enhance symmetrically. No hydronephrosis. No hydroureter. The urinary bladder is unremarkable. Stomach/Bowel: Stomach is within normal limits. No evidence of bowel wall thickening or dilatation. Appendix appears normal. Lymphatic: No lymphadenopathy. Reproductive: Prostate is unremarkable. Other: No intraperitoneal free fluid. No intraperitoneal free gas. No organized fluid collection. Musculoskeletal: No abdominal wall hernia.  Diffuse subcutaneus soft tissue edema. No suspicious lytic or blastic osseous lesions. No acute displaced fracture. Multilevel degenerative changes of the spine. IMPRESSION: VASCULAR 1. 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 Associated mass effect onto the IVC with no visualization identified. Recommend referral to or continued care with vascular specialist. (Ref.: J Vasc Surg. 2018; 67:2-77 and J Am Coll Radiol 2013;10(10):789-794.) 2. Aortic Atherosclerosis (ICD10-I70.0) including coronary artery, mitral annular, aortic valve leaflet calcifications-correlate for aortic stenosis. 3. Right common femoral artery stent with evaluation of patency limited due to timing of contrast. NON-VASCULAR 1. Question cholelithiasis. Nonspecific gallbladder wall thickening. Correlate with liver function test and consider right upper quadrant  ultrasound for a more sensitive evaluation of the gallbladder 2. Colonic diverticulosis with no acute diverticulitis. 3. Small hiatal hernia. 4. Bilateral small pleural effusions. 5. Cardiomegaly. Electronically Signed   By: Morgane  Naveau M.D.   On: 12/09/2023 17:53   DG Chest Portable 1 View Result Date: 12/09/2023 CLINICAL DATA:  Syncope. EXAM: PORTABLE CHEST 1 VIEW COMPARISON:  Chest radiograph dated 08/04/2022. FINDINGS: There is cardiomegaly with mild vascular congestion. Faint bilateral interstitial densities may represent edema or atypical pneumonia. Small left pleural effusion and left lung base atelectasis or infiltrate. No pneumothorax. Stable cardiomegaly. Median sternotomy wires and CABG vascular clips. Left pectoral pacemaker device. No acute osseous pathology. IMPRESSION: 1. Cardiomegaly with mild vascular congestion. 2. Small left pleural effusion and left lung base atelectasis or infiltrate. Electronically Signed   By: Vanetta Chou M.D.   On: 12/09/2023 15:41     Assessment and Plan: Bradycardia Syncope Junctional rhythm Paroxysmal atrial fibrillation -s/p SJ PPM in 2017 in [facility], went ERI today, suspect this is the etiology of his syncope -NPO after midnight, EP to see in AM, anticipate will need gen change -watch in 2H, temporary pacer if needed in interim -echo ordered -on no nodal medications -not on anticoagulation  CAD with reported prior PCI PAD s/p prior procedured Carotid stenosis, bilateral AAA -has been recommended for aspirin , clopidogrel, statin, does not appear he was taking in [facility] or taking here  Dementia -patient is a poor historian.   Anemia Hematemesis -on no antiplatelets or anticoagulants at home -Hgb 8.0, was 10.9 a year ago and 9.5 4 mos ago -management per primary team   Risk Assessment/Risk Scores:         CHA2DS2-VASc Score =     This indicates a  % annual risk of stroke. The patient's score is based upon:           For questions or updates, please contact [facility] Please consult [email or web address] for contact info under    Signed, Shelda Bruckner, MD  12/09/2023 6:02 PM

## 2023-12-10 ENCOUNTER — Encounter (HOSPITAL_COMMUNITY): Payer: Self-pay | Admitting: Cardiology

## 2023-12-10 ENCOUNTER — Inpatient Hospital Stay (HOSPITAL_COMMUNITY)

## 2023-12-10 DIAGNOSIS — Z950 Presence of cardiac pacemaker: Secondary | ICD-10-CM

## 2023-12-10 DIAGNOSIS — E119 Type 2 diabetes mellitus without complications: Secondary | ICD-10-CM

## 2023-12-10 DIAGNOSIS — R7989 Other specified abnormal findings of blood chemistry: Secondary | ICD-10-CM

## 2023-12-10 DIAGNOSIS — R55 Syncope and collapse: Secondary | ICD-10-CM

## 2023-12-10 DIAGNOSIS — K922 Gastrointestinal hemorrhage, unspecified: Secondary | ICD-10-CM

## 2023-12-10 DIAGNOSIS — Z7189 Other specified counseling: Secondary | ICD-10-CM

## 2023-12-10 DIAGNOSIS — R001 Bradycardia, unspecified: Secondary | ICD-10-CM | POA: Diagnosis not present

## 2023-12-10 LAB — BASIC METABOLIC PANEL WITH GFR
Anion gap: 17 — ABNORMAL HIGH (ref 5–15)
BUN: 21 mg/dL (ref 8–23)
CO2: 16 mmol/L — ABNORMAL LOW (ref 22–32)
Calcium: 9 mg/dL (ref 8.9–10.3)
Chloride: 106 mmol/L (ref 98–111)
Creatinine, Ser: 1.36 mg/dL — ABNORMAL HIGH (ref 0.61–1.24)
GFR, Estimated: 52 mL/min — ABNORMAL LOW (ref >60)
Glucose, Bld: 111 mg/dL — ABNORMAL HIGH (ref 70–99)
Potassium: 3.9 mmol/L (ref 3.5–5.1)
Sodium: 139 mmol/L (ref 135–145)

## 2023-12-10 LAB — ECHOCARDIOGRAM COMPLETE
Area-P 1/2: 9.48 cm2
Calc EF: 43.7 %
Height: 70 in
MV VTI: 1.48 cm2
S' Lateral: 3.1 cm
Single Plane A2C EF: 62.7 %
Single Plane A4C EF: 24.1 %
Weight: 2035.29 [oz_av]

## 2023-12-10 LAB — CBC
HCT: 24.9 % — ABNORMAL LOW (ref 39.0–52.0)
Hemoglobin: 7.8 g/dL — ABNORMAL LOW (ref 13.0–17.0)
MCH: 29 pg (ref 26.0–34.0)
MCHC: 31.3 g/dL (ref 30.0–36.0)
MCV: 92.6 fL (ref 80.0–100.0)
Platelets: 236 K/uL (ref 150–400)
RBC: 2.69 MIL/uL — ABNORMAL LOW (ref 4.22–5.81)
RDW: 16.5 % — ABNORMAL HIGH (ref 11.5–15.5)
WBC: 15.1 K/uL — ABNORMAL HIGH (ref 4.0–10.5)
nRBC: 0.1 % (ref 0.0–0.2)

## 2023-12-10 LAB — MAGNESIUM: Magnesium: 1.8 mg/dL (ref 1.7–2.4)

## 2023-12-10 LAB — MRSA NEXT GEN BY PCR, NASAL: MRSA by PCR Next Gen: NOT DETECTED

## 2023-12-10 MED ORDER — ALBUTEROL SULFATE (2.5 MG/3ML) 0.083% IN NEBU: 2.5000 mg | INHALATION_SOLUTION | Freq: Four times a day (QID) | RESPIRATORY_TRACT | Status: DC | PRN

## 2023-12-10 MED ORDER — MORPHINE SULFATE (PF) 2 MG/ML IV SOLN
1.0000 mg | INTRAVENOUS | Status: DC | PRN
  Administered 2023-12-10 – 2023-12-11 (×3): 1 mg via INTRAVENOUS
  Filled 2023-12-10 (×3): qty 1

## 2023-12-10 MED ORDER — POTASSIUM CHLORIDE 10 MEQ/50ML IV SOLN
10.0000 meq | INTRAVENOUS | Status: AC
  Administered 2023-12-10 (×2): 10 meq via INTRAVENOUS
  Filled 2023-12-10 (×2): qty 50

## 2023-12-10 MED ORDER — ENSURE PLUS HIGH PROTEIN PO LIQD: 237.0000 mL | Freq: Two times a day (BID) | ORAL | Status: DC

## 2023-12-10 MED ORDER — DEXMEDETOMIDINE HCL IN NACL 400 MCG/100ML IV SOLN
0.0000 ug/kg/h | INTRAVENOUS | Status: DC
  Administered 2023-12-10 – 2023-12-11 (×2): 0.4 ug/kg/h via INTRAVENOUS
  Filled 2023-12-10: qty 100

## 2023-12-10 MED ORDER — MAGNESIUM SULFATE 2 GM/50ML IV SOLN
2.0000 g | Freq: Once | INTRAVENOUS | Status: AC
  Administered 2023-12-10: 2 g via INTRAVENOUS
  Filled 2023-12-10: qty 50

## 2023-12-10 MED ORDER — GLYCOPYRROLATE 0.2 MG/ML IJ SOLN
0.2000 mg | INTRAMUSCULAR | Status: DC | PRN
  Administered 2023-12-10 – 2023-12-16 (×17): 0.2 mg via INTRAVENOUS
  Filled 2023-12-10 (×21): qty 1

## 2023-12-10 MED ORDER — ONDANSETRON HCL 4 MG/2ML IJ SOLN: 4.0000 mg | Freq: Four times a day (QID) | INTRAMUSCULAR | Status: DC | PRN

## 2023-12-10 MED ORDER — CHLORHEXIDINE GLUCONATE CLOTH 2 % EX PADS
6.0000 | MEDICATED_PAD | Freq: Every day | CUTANEOUS | Status: DC
  Administered 2023-12-10 – 2023-12-12 (×3): 6 via TOPICAL

## 2023-12-10 MED ORDER — DEXMEDETOMIDINE HCL IN NACL 400 MCG/100ML IV SOLN
0.0000 ug/kg/h | INTRAVENOUS | Status: DC
  Administered 2023-12-10: 0.4 ug/kg/h via INTRAVENOUS
  Filled 2023-12-10: qty 100

## 2023-12-10 MED ORDER — POTASSIUM CHLORIDE 10 MEQ/100ML IV SOLN
10.0000 meq | INTRAVENOUS | Status: DC
  Filled 2023-12-10: qty 100

## 2023-12-10 MED ORDER — ALBUTEROL SULFATE (5 MG/ML) 0.5% IN NEBU: 2.5000 mg | INHALATION_SOLUTION | Freq: Four times a day (QID) | RESPIRATORY_TRACT | Status: DC | PRN

## 2023-12-10 NOTE — Progress Notes (Signed)
 eLink Physician-Brief Progress Note Patient Name: Manuel Vargas DOB: January 28, 1941 MRN: [id number]   Date of Service  12/10/2023  HPI/Events of Note  Patient with dementia and now with sundowning. Has had a TVP placed for bradycardia and torsades. VP at 80 with no issues but trying to climb out of bed, pulling lines so at risk of dislodgement of his TVP. Haldol would not be a good choice given the VT/VF and now that he is paced and with TVP at 80, RN asking for precedx order. Seen on camera and agree.   eICU Interventions  Precedex  ordered     Intervention Category Major Interventions: Arrhythmia - evaluation and management  Makaylee Spielberg G Luisfelipe [place]elstad 12/10/2023, 3:37 AM

## 2023-12-10 NOTE — IPAL (Signed)
  Interdisciplinary Goals of Care Family Meeting   Date carried out: 12/10/2023  Location of the meeting: Bedside  Member's involved: Nurse Practitioner, Bedside Registered Nurse, and Family Member or next of kin  Durable Power of Attorney or acting medical decision maker: Wife  Discussion: We discussed goals of care for Manuel Vargas .  Discussed with wife, two daughters, and son-in-law at bedside along with patient.  Patient having periods of lucidity during conversation but decisions about goals of care will be made by wife, who would like daughters to be involved with the decision making process.  Currently patient has transvenous pacer in.  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  We discussed plan of care and goals of care in regards to what the patient, Manuel Vargas, would want in this situation.  Patient is very deconditioned and cachectic at baseline.  Per wife, and family patient does not eat as much as he used to.  Patient has also lost a significant amount of weight as well.  And what Manuel Vargas could participate in, [place] stated that he wanted painless treatment and made comfortable in the long run.  Apparently, patient and wife have living will that they would not want to be connected to devices keeping them alive or prolonging their life according to the daughters at bedside.  Discussed with wife and daughters along with son-in-law that we are concerned that patient would not tolerate a permanent pacemaker replacement but could cause additional pain/suffering.  Patient, wife, daughters along with son-in-law at bedside agreed that they would not want to undergo another procedure.  Recommendations were to make patient a DNR/DNI-discussed with patient and family that we would not perform chest compressions, defibrillation, ACLS medications, or intubation in the event of a cardiopulmonary arrest.  Patient and family in agreement with this.  Also  discussed that patient will not be able to continue transvenous pacer and the focus should be on quality of life/comfort instead.  Talked with wife, daughters, son-in-law and most importantly patient that once the temporary transvenous pacer was removed that his heart would slow down and eventually the patient would pass away/die within minutes to hours.  Patient, wife, daughters, and son-in-law all in agreement that they would like to pursue comfort care if patient worsens prior to another daughter arriving from [place] but plan would be to transition to comfort around approximately 1200 on 12/11/2023.  Code status:   Code Status: Limited: Do not attempt resuscitation (DNR) -DNR-LIMITED -Do Not Intubate/DNI    Disposition: Continue current acute care- plan is to transition to comfort care tomorrow on 12/11/23  After daughter from [place] has arrived  Once family, and especially daughter has arrived from [place]. Place comfort care orders in.  Recommend starting a continuous morphine  infusion to be started prior to removing TVP   Time spent for the meeting: 35 mins    Christian Avary Eichenberger AGACNP-BC   [facility] 12/10/2023, 1:08 PM  Please see [email or web address] for pager details.  From 7A-7P if no response, please call [phone]. After hours, please call ELink [phone].

## 2023-12-10 NOTE — Plan of Care (Signed)
  Problem: Education: Goal: Knowledge of General Education information will improve Description: Including pain rating scale, medication(s)/side effects and non-pharmacologic comfort measures - goals of care to be discussed with patient's family at bedside - patient to remain comfortable and free of acute pain Outcome: Progressing   Problem: Health Behavior/Discharge Planning: Goal: Ability to manage health-related needs will improve Outcome: Progressing   Problem: Clinical Measurements: Goal: Ability to maintain clinical measurements within normal limits will improve Outcome: Progressing Goal: Will remain free from infection Outcome: Progressing Goal: Diagnostic test results will improve Outcome: Progressing Goal: Respiratory complications will improve Outcome: Progressing Goal: Cardiovascular complication will be avoided Outcome: Progressing   Problem: Activity: Goal: Risk for activity intolerance will decrease Outcome: Progressing   Problem: Nutrition: Goal: Adequate nutrition will be maintained Outcome: Progressing   Problem: Coping: Goal: Level of anxiety will decrease Outcome: Progressing   Problem: Elimination: Goal: Will not experience complications related to bowel motility Outcome: Progressing Goal: Will not experience complications related to urinary retention Outcome: Progressing   Problem: Pain Managment: Goal: General experience of comfort will improve and/or be controlled Outcome: Progressing   Problem: Safety: Goal: Ability to remain free from injury will improve Outcome: Progressing   Problem: Skin Integrity: Goal: Risk for impaired skin integrity will decrease Outcome: Progressing

## 2023-12-10 NOTE — Consult Note (Addendum)
 Extension                                               Consultation Note   Referring Provider:  PCCM PCP: Amon Aloysius BRAVO, MD Primary Gastroenterologist: Sampson       Reason for Consultation: Coffee-ground emesis DOA: 12/09/2023         Hospital Day: 2   Attending physician's note  I personally saw the patient and performed a substantive portion of this encounter (>50% time spent), including a complete performance of at least one of the key components (MDM, Hx and/or Exam), in conjunction with the APP.  I agree with the APP's note, impression, and recommendations with additional input as follows.     83 year old male with dementia, multiple comorbidities, admitted with severe symptomatic bradycardia, had episodes of dark emesis which have since resolved No melena, Hemoccult negative Family discussed with ICU team and is transitioning to comfort care We will hold off any endoscopic evaluation or intervention at this point Supportive care per ICU team Please call GI back if needed   K. Wilkie Mcgee , MD [phone]    ASSESSMENT    83 year old male with medical history of dementia, diabetes , atrial fibrillation , pacemaker, PAD,  AAA, CAD.  Admitted with bradycardia /syncope, elevated LFTs and dark emesis.  Since admission patient has been found to have severe symptomatic bradycardia /  high degree AV block. He was made comfort care this am.   Dark emesis, resolved.   No melena.  Hemoccult negative. 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 GI will be available should there be a change in management / plans.  -Continue BID IV PPI   HPI   83 y.o. year old male with a medical history including but not limited to dementia, diabetes , atrial fibrillation , pacemaker, PAD with reported prior procedures , AAA, CAD with reported PCI,  GERD  Brief GI History:  None  Patient admitted with syncope , bradycardia  (heart rate in 30s ) and reported black emesis.   In the ED his head CT was negative for acute findings. Hemoglobin was down to 1.5 g compared to March.  He was Hemoccult negative.  BUN normal.   Relevant workup thus far: - Presenting hgb 8 ( down from 9.5 in March), spontaneous rise in hgb to 8.6. No H/H done today.  - MCV 93.8.   - AST 106 >>228.  - ALT 53 >> 115 - Tbili 2.3 >> 2 -Albumin 2.9 - INR 1.4 - Alk phos normal - BUN and Cr normal.  - FOBT negative - CTA abd / pelvis  AAA, severe atherosclerosis, possible gallstones.  May be gallbladder wall thickening, no bile duct dilation.  Liver unremarkable.  Pancreas unremarkable.  Diverticulosis. ( see full report ) - Chest x-ray -cardiomegaly, mild vascular congestion, small L pleural effusion  and left lung base atelectasis or infiltrate  Interval History:  Patient evaluated by Cardiology yesterday.   PPM interrogation showed malfunction at end of battery life.  EP saw him and planned gen change when more stable but otherwise TVP if became unable.  In CCU patient has been having bradycardic events with torsades.   LIMITED HISTORY TAKEN AND PHYSICAL EXAM PERFORMED as patient has been made comfort care since consult was called.  Family visiting and patient is having FaceTime with other family member right now. Per wife patient has been having N/V at home for ~ a month but wasn't evaluated medically for this. After admission he did vomit yesterday but emesis was clear.    Labs and Imaging:  Recent Labs     12/09/23 1512 12/09/23 1949  PROT 7.1 7.2  ALBUMIN 2.9* 3.0*  AST 106* 228*  ALT 53* 115*  ALKPHOS 70 80  BILITOT 2.3* 2.0*   Recent Labs    12/09/23 1512 12/09/23 1949  WBC 5.0 5.8  HGB 8.0* 8.6*  HCT 25.5* 28.2*  MCV 93.8 94.0  PLT 256 267   Recent Labs    12/09/23 1512 12/09/23 1949  NA 141 143  K 3.7 4.1  CL 110 108  CO2 20* 18*  GLUCOSE 121* 115*  BUN 18 17  CREATININE 1.28* 1.21  CALCIUM 9.5 9.7    Pertinent GI Studies    None found   Past Medical History:  Diagnosis Date   AAA (abdominal aortic aneurysm) (HCC)    Anemia    Atrial fibrillation (HCC)    Blood transfusion without reported diagnosis    CAD (coronary artery disease)    Dementia (HCC)    Diverticulosis of colon    GERD (gastroesophageal reflux disease)    Hepatic steatosis    History of malaria    Hyperlipidemia    Hypertension    Type 2 diabetes mellitus (HCC)    VSD (ventricular septal defect)     Past Surgical History:  Procedure Laterality Date   ATRIAL FIBRILLATION ABLATION  2013   CORONARY ANGIOGRAM  2013   w/ angioplasty   CORONARY ANGIOGRAM  2010   CORONARY ARTERY BYPASS GRAFT  1993   x2   ILIAC ARTERY STENT  2005   LOWER EXTREMITY ANGIOGRAM Right 2017   PACEMAKER INSERTION  2013   PTCA  2015   multiple procedures last in 2015   TEMPORARY PACEMAKER N/A 12/09/2023   Procedure: TEMPORARY PACEMAKER;  Surgeon: Swaziland, Peter M, MD;  Location: [facility] INVASIVE CV LAB;  Service: Cardiovascular;  Laterality: N/A;   VSD REPAIR  1974    Family History  Family history unknown: Yes    Prior to Admission medications   Medication Sig Start Date End Date Taking? Authorizing Provider  potassium chloride  (KLOR-CON  M) 10 MEQ tablet Take 1 tablet (10 mEq total) by mouth every other day. Patient not taking: Reported on 12/10/2023 07/30/23   Amon Aloysius BRAVO, MD    Current Facility-Administered Medications  Medication Dose Route Frequency Provider Last Rate Last Admin   acetaminophen   (TYLENOL ) tablet 650 mg  650 mg Oral Q6H PRN Sw[place], Peter M, MD       Or   acetaminophen  (TYLENOL ) suppository 650 mg  650 mg Rectal Q6H PRN Swaziland, Peter M, MD       dexmedetomidine  (PRECEDEX ) 400 MCG/100ML (4 mcg/mL) infusion  0-1.2 mcg/kg/hr Intravenous Titrated Kamat, Sunil G, MD 7.21 mL/hr at 12/10/23 0400 0.5 mcg/kg/hr at 12/10/23 0400  magnesium  sulfate IVPB 2 g 50 mL  2 g Intravenous Once Swaziland, Peter M, MD       pantoprazole  (PROTONIX ) injection 40 mg  40 mg Intravenous Q12H Swaziland, Peter M, MD       sodium chloride  flush (NS) 0.9 % injection 3 mL  3 mL Intravenous Q12H Swaziland, Peter M, MD        Allergies as of 12/09/2023   (No Known Allergies)    Social History   Socioeconomic History   Marital status: Married    Spouse name: Not on file   Number of children: Not on file   Years of education: Not on file   Highest education level: Not on file  Occupational History   Occupation: retired; Comptroller  Tobacco Use   Smoking status: Former    Types: Cigarettes   Smokeless tobacco: Never   Tobacco comments:    1 PPD x years, quit ~2009  Substance and Sexual Activity   Alcohol  use: Yes    Alcohol /week: 7.0 - 9.0 standard drinks of alcohol     Types: 5 - 6 Glasses of wine, 2 - 3 Shots of liquor per week    Comment: ETOH most day   Drug use: Never   Sexual activity: Not on file  Other Topics Concern   Not on file  Social History Narrative   Born in [place] from a [place] familiy   Moved to the [place]  in the 60s   Has 1 daughter    Social Drivers of Corporate investment banker Strain: Not on file  Food Insecurity: Not on file  Transportation Needs: Not on file  Physical Activity: Not on file  Stress: Not on file  Social Connections: Not on file  Intimate Partner Violence: Not on file     Code Status   Code Status: Full Code  Review of Systems: All systems reviewed and negative except where noted in HPI.  Physical Exam: Vital signs in last 24  hours: Temp:  [96.9 F (36.1 C)-97.8 F (36.6 C)] 97.3 F (36.3 C) (07/21 2315) Pulse Rate:  [0-96] 80 (07/22 0700) Resp:  [11-29] 18 (07/22 0700) BP: (95-214)/(54-98) 95/59 (07/22 0700) SpO2:  [77 %-100 %] 100 % (07/22 0700) Weight:  [57.7 kg-77.1 kg] 57.7 kg (07/21 1830) Last BM Date :  (PTA)  General:  Pleasant thin male in NAD Psych:  Cooperative. Normal mood and affect Eyes: Pupils equal Ears:  Normal auditory acuity Nose: No deformity, discharge or lesions Heart:  Regular rate  Abdomen:  Soft, nondistended, nontender, active bowel sounds, no masses felt Rectal :  Deferred Msk: Symmetrical without gross deformities.  Neurologic:  Alert, oriented, grossly normal neurologically Skin:  Intact without significant lesions.    Intake/Output from previous day: 07/21 0701 - 07/22 0700 In: 1.6 [I.V.:1.6] Out: -  Intake/Output this shift:  No intake/output data recorded.   Vina Dasen, NP-C   12/10/2023, 8:42 AM

## 2023-12-10 NOTE — Progress Notes (Signed)
 Patient in and out of Coarse Vfib vs. Torsades x 3 total that required shocking. Dr. Delford aware and Dr. Swaziland and Cath Lab team en route to place TVP.

## 2023-12-10 NOTE — TOC Initial Note (Signed)
 Transition of Care [facility]) - Initial/Assessment Note    Patient Details  Name: Manuel Vargas MRN: [id number] Date of Birth: 09/27/1940  [facility]) CM/SW Contact:    Justina Delcia Czar, RN Phone Number: [phone] 12/10/2023, 4:15 PM  Clinical Narrative:                 Chart reviewed, plan is comfort care tomorrow.   Expected Discharge Plan:  (palliative)     Patient Goals and CMS Choice            Expected Discharge Plan and Services   Discharge Planning Services: CM Consult                                          Prior Living Arrangements/Services                       Activities of Daily Living      Permission Sought/Granted Permission sought to share information with : Case Manager                Emotional Assessment              Admission diagnosis:  Symptomatic bradycardia [R00.1] Patient Active Problem List   Diagnosis Date Noted   Hematemesis 12/09/2023   Syncope, cardiogenic 12/09/2023   Symptomatic bradycardia 12/09/2023   Atrial fibrillation (HCC) 08/22/2023   GERD (gastroesophageal reflux disease) 08/22/2023   Post PTCA 08/22/2023   AAA (abdominal aortic aneurysm) (HCC) 08/22/2023   Angina pectoris (HCC) 08/22/2023   Type 2 diabetes mellitus (HCC) 08/22/2023   Diverticulosis 08/22/2023   Hepatic steatosis 08/22/2023   Dyspnea 08/22/2023   Generalized edema 08/22/2023   Goals of care, counseling/discussion 07/27/2023   VSD (ventricular septal defect) 07/26/2023   Dementia (HCC) 07/26/2023   PVD (peripheral vascular disease) (HCC) 07/26/2023   Coronary atherosclerosis 04/09/2022   Deficiency of other specified B group vitamins 04/09/2022   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 04/09/2022   PCP:  Amon Aloysius BRAVO, MD Pharmacy:   [facility] [id number] [place], [place] - [address] [place] [address] Phone: [phone] Fax:  [phone]     Social Drivers of Health (SDOH) Social History: SDOH Screenings   Food Insecurity: Patient Unable To Answer (12/10/2023)  Housing: Unknown (12/10/2023)  Transportation Needs: Patient Unable To Answer (12/10/2023)  Utilities: Patient Unable To Answer (12/10/2023)  Depression (PHQ2-9): Low Risk  (07/26/2023)  Social Connections: Unknown (12/10/2023)  Tobacco Use: Medium Risk (12/09/2023)   SDOH Interventions:     Readmission Risk Interventions     No data to display

## 2023-12-10 NOTE — Consult Note (Signed)
 NAME:  Manuel Vargas, MRN:  [id number], DOB:  March 30, 1941, LOS: 1 ADMISSION DATE:  12/09/2023, CONSULTATION DATE:  7/22  REFERRING MD: Seena CHIEF COMPLAINT:  Bradycardia/Syncope/Hematemesis   History of Present Illness:  83yr old male with hx of dementia, VSD repair (1974), CAD s/p CABG (1993) and stent, afib s/p ablation, PPM (2013), PAD s/p PTA, iliac stent and atherectomy of R popliteal, hypertension, dyslipidemia, DM, AAA. EMS called due to syncopal episode with subsequent emesis of black material as reported by wife. He was noted to be bradycardic. On PPM interrogation pacemaker not functioning appropriately.   Initial lab work revealed sodium of 141, K3.7, CL 110, CO2 20, glucose 121, creatinine 1.28, magnesium  1.9, AST 106, ALT 53, GFR 56, hemoglobin 8, HCT 25.5, platelets 256, PT 18, INR 1.4   Patient's EKG noted to be in junctional rhythm-heart rate 30s to 35 Patient received 1 dose of atropine  in the emergency department.  Cardiology/GI was consulted to evaluate patient.  Upon arrival to CCU, patient having bradycardic events with torsades.  Patient had TVP placed for bradycardia/torsades. Overnight patient developed severe sundowning-has history of dementia.  Patient started on Precedex  drip to assist with sundowning and agitation.     Pertinent  Medical History   Past Medical History:  Diagnosis Date   AAA (abdominal aortic aneurysm) (HCC)    Anemia    Atrial fibrillation (HCC)    Blood transfusion without reported diagnosis    CAD (coronary artery disease)    Dementia (HCC)    Diverticulosis of colon    GERD (gastroesophageal reflux disease)    Hepatic steatosis    History of malaria    Hyperlipidemia    Hypertension    Type 2 diabetes mellitus (HCC)    VSD (ventricular septal defect)      Significant Hospital Events: Including procedures, antibiotic start and stop dates in addition to other pertinent events   -7/21 admitted with bradycardia/junctional  rhythm-developed torsades, needing TVP placed.  Developed severe sundowning needing Precedex  overnight 7/22  Interim History / Subjective:  Placed on Precedex  overnight due to sundowning   Objective    Blood pressure (!) 95/59, pulse 80, temperature (!) 97.3 F (36.3 C), temperature source Oral, resp. rate 18, height 5' 10 (1.778 m), weight 57.7 kg, SpO2 100%.        Intake/Output Summary (Last 24 hours) at 12/10/2023 0735 Last data filed at 12/10/2023 0400 Gross per 24 hour  Intake 1.64 ml  Output --  Net 1.64 ml   Filed Weights   12/09/23 1512 12/09/23 1830  Weight: 77.1 kg 57.7 kg    Examination: General: Acute on chronic, cachectic, elderly male lying in ICU bed, NAD HENT: Normocephalic, PERRLA intact-1 mm B/L, sluggish, missing teeth, pink MM Lungs: Clear, diminished in lower bases, no acute distress/labored breathing Cardiovascular: TVP-80 rate, S1/S2, no JVD Abdomen: BS active, soft Extremities: Frail, cachectic/malnourished, moves extremities to pain Neuro: RASS -2, on Precedex , response to painful stimuli GU: Intact  Resolved problem list   Assessment and Plan  Symptomatic bradycardia Cardiogenic syncope S/P pacemaker History of A-fib - Presumed malfunction of pacemaker Noted with heart rate within the 30s/junctional rhythm-developing torsades TVP placed by cardiology on 7/21. P: Continue ICU monitoring, with cardiac telemetry Continue to optimize electrolytes and monitor for imbalances Avoid UTC prolonging medications as possible  Hematemsis/GI bleed Anemia  - During syncopal event prompting admission, patient noted to have dark vomiting Fecal occult negative.  Hemoglobin noted to be 8/HCT 25.5, baseline appears  to be around approximately 10-11 Hgb 8.6 from 8.0, no evidence of bleeding  P: GI consulted, has not seen yet Transfuse for hemoglobin less than 7 Monitor for signs of bleeding Aspiration precautions N.p.o. for now, once cleared- will need  speech evaluation   Dementia/delirium Dementia hx - Had episode of sundowning with severe agitation overnight on 7/21  Needing Precedex  infusion Presumably not taking any medications per documentations P: Currently on Precedex , began to wean off Initiate delirium precautions May need safety sitter overnight to avoid Precedex  infusion  Diabetes type 2 P: Check hemoglobin A1c CBG every 4 hours  PAD CAD AAA Hyperlipidemia Hepatic steatosis  Per report the patient was all/not taking any prescribed medications P: Continue supportive care   GOC  Patient is malnourished/cachectic Apparently patient has been off medications for some time Per report from nursing staff-family wanted to get patient and wife back to [place] to set residence into a memory care facility.  With patient's current state do not believe this is possible.  Patient will need eventually permanent replaced and less goals of care can be established and a palliative approach be taken.  Currently no family at bedside will attempt to reach out later today on 7/22 P: Palliative care consult-need to discuss CODE STATUS/continued goals of care - Recommend focusing on quality of life versus quantity  Best Practice (right click and Reselect all SmartList Selections daily)   Diet/type: NPO DVT prophylaxis SCD Pressure ulcer(s): N/A GI prophylaxis: PPI Lines: N/A Foley:  N/A Code Status:  full code Last date of multidisciplinary goals of care discussion pending on 7/22   Labs   CBC: Recent Labs  Lab 12/09/23 1512 12/09/23 1949  WBC 5.0 5.8  NEUTROABS 4.0  --   HGB 8.0* 8.6*  HCT 25.5* 28.2*  MCV 93.8 94.0  PLT 256 267    Basic Metabolic Panel: Recent Labs  Lab 12/09/23 1512 12/09/23 1949 12/09/23 1952  NA 141 143  --   K 3.7 4.1  --   CL 110 108  --   CO2 20* 18*  --   GLUCOSE 121* 115*  --   BUN 18 17  --   CREATININE 1.28* 1.21  --   CALCIUM 9.5 9.7  --   MG 1.9 2.2  --   PHOS  --   --   2.4*   GFR: Estimated Creatinine Clearance: 37.8 mL/min (by C-G formula based on SCr of 1.21 mg/dL). Recent Labs  Lab 12/09/23 1512 12/09/23 1949  WBC 5.0 5.8    Liver Function Tests: Recent Labs  Lab 12/09/23 1512 12/09/23 1949  AST 106* 228*  ALT 53* 115*  ALKPHOS 70 80  BILITOT 2.3* 2.0*  PROT 7.1 7.2  ALBUMIN 2.9* 3.0*   No results for input(s): LIPASE, AMYLASE in the last 168 hours. No results for input(s): AMMONIA in the last 168 hours.  ABG    Component Value Date/Time   TCO2 21 (L) 01/15/2022 0311     Coagulation Profile: Recent Labs  Lab 12/09/23 1512  INR 1.4*    Cardiac Enzymes: Recent Labs  Lab 12/09/23 1512  CKTOTAL 278    HbA1C: Hgb A1c MFr Bld  Date/Time Value Ref Range Status  07/26/2023 02:27 PM 5.5 <5.7 % of total Hgb Final    Comment:    For the purpose of screening for the presence of diabetes: . <5.7%       Consistent with the absence of diabetes 5.7-6.4%    Consistent with  increased risk for diabetes             (prediabetes) > or =6.5%  Consistent with diabetes . This assay result is consistent with a decreased risk of diabetes. . Currently, no consensus exists regarding use of hemoglobin A1c for diagnosis of diabetes in children. . According to American Diabetes Association (ADA) guidelines, hemoglobin A1c <7.0% represents optimal control in non-pregnant diabetic patients. Different metrics may apply to specific patient populations.  Standards of Medical Care in Diabetes(ADA). .     CBG: Recent Labs  Lab 12/09/23 1953  GLUCAP 122*    Review of Systems:   See HPI   Past Medical History:  He,  has a past medical history of AAA (abdominal aortic aneurysm) (HCC), Anemia, Atrial fibrillation (HCC), Blood transfusion without reported diagnosis, CAD (coronary artery disease), Dementia (HCC), Diverticulosis of colon, GERD (gastroesophageal reflux disease), Hepatic steatosis, History of malaria, Hyperlipidemia,  Hypertension, Type 2 diabetes mellitus (HCC), and VSD (ventricular septal defect).   Surgical History:   Past Surgical History:  Procedure Laterality Date   ATRIAL FIBRILLATION ABLATION  2013   CORONARY ANGIOGRAM  2013   w/ angioplasty   CORONARY ANGIOGRAM  2010   CORONARY ARTERY BYPASS GRAFT  1993   x2   ILIAC ARTERY STENT  2005   LOWER EXTREMITY ANGIOGRAM Right 2017   PACEMAKER INSERTION  2013   PTCA  2015   multiple procedures last in 2015   TEMPORARY PACEMAKER N/A 12/09/2023   Procedure: TEMPORARY PACEMAKER;  Surgeon: Swaziland, Peter M, MD;  Location: [facility] INVASIVE CV LAB;  Service: Cardiovascular;  Laterality: N/A;   VSD REPAIR  1974     Social History:   reports that he has quit smoking. His smoking use included cigarettes. He has never used smokeless tobacco. He reports current alcohol  use of about 7.0 - 9.0 standard drinks of alcohol  per week. He reports that he does not use drugs.   Family History:  His Family history is unknown by patient.   Allergies No Known Allergies   Home Medications  Prior to Admission medications   Medication Sig Start Date End Date Taking? Authorizing Provider  potassium chloride  (KLOR-CON  M) 10 MEQ tablet Take 1 tablet (10 mEq total) by mouth every other day. 07/30/23   Amon Aloysius BRAVO, MD     Critical care time: 45 mins     Christian Claudene CANNON   [facility] 12/10/2023, 8:31 AM  Please see [email or web address] for pager details.  From 7A-7P if no response, please call [phone]. After hours, please call ELink [phone].

## 2023-12-10 NOTE — Progress Notes (Addendum)
 NP at bedside with patient, patient's spouse and two daughters to discuss goals of care  1500 Patient requesting water. Notified provider, per NP okay to give liquid and diet as patient tolerates but he is high risk for aspiration and to make sure family is aware of this risk. Educated patient and family that he is at risk for aspiration, family verbalizes understanding and to give him food and water as he can tolerate to keep comfortable.

## 2023-12-10 NOTE — Progress Notes (Signed)
 eLink Physician-Brief Progress Note Patient Name: Manuel Vargas DOB: May 13, 1941 MRN: [id number]   Date of Service  12/10/2023  HPI/Events of Note  Patient complaining of nausea, he is transitioning to comfort care.  eICU Interventions  PRN Zofran  ordered.        Marcellina RAYMOND Dub 12/10/2023, 10:41 PM

## 2023-12-10 NOTE — Consult Note (Addendum)
 ELECTROPHYSIOLOGY CONSULT NOTE    Patient ID: Manuel Vargas MRN: [id number], DOB/AGE: 1940-06-19 83 y.o.  Admit date: 12/09/2023 Date of Consult: 12/10/2023  Primary Physician: Amon Aloysius BRAVO, MD Primary Cardiologist: None  Electrophysiologist: New   Referring Provider: Dr. Harold  Patient Profile: Manuel Vargas is a 83 y.o. 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 Harold.  HPI:  Manuel Vargas is a 83 y.o. male with medical history as above who presented via EMS due to syncopal episode with subsequent emesis of black material per wife.   Initial lab work revealed sodium of 141, K3.7, CL 110, CO2 20, glucose 121, creatinine 1.28, magnesium  1.9, AST 106, ALT 53, GFR 56, hemoglobin 8, HCT 25.5, platelets 256, PT 18, INR 1.4   Pts EKG on arrival with junctional bradycardia in the 30s, no significant improvement with atropine .  Pt transferred to CCU where he begun to have Torsades in the setting of on going bradycardia and was taken urgently for TVP.     Pt had severe agitation and sundowning in the setting of his dementia. Patient started on precedex  drip.   Pt is sleeping on exam. RN states he had recurrent, dark vomitus last night and CXR pending with desaturations this am concerning for aspiration.  Pt sedated and not currently participatory in conversation. No family present on my exam.   Labs Potassium3.9 (07/22 0818) Magnesium   1.8 (07/22 0818) Creatinine, ser  1.36* (07/22 0818) PLT  236 (07/22 0818) HGB  7.8* (07/22 0818) WBC 15.1* (07/22 0818) Troponin I (High Sensitivity)75* (07/21 1717).    Past Medical History:  Diagnosis Date   AAA (abdominal aortic aneurysm) (HCC)     Anemia    Atrial fibrillation (HCC)    Blood transfusion without reported diagnosis    CAD (coronary artery disease)    Dementia (HCC)    Diverticulosis of colon    GERD (gastroesophageal reflux disease)    Hepatic steatosis    History of malaria    Hyperlipidemia    Hypertension    Type 2 diabetes mellitus (HCC)    VSD (ventricular septal defect)      Surgical History:  Past Surgical History:  Procedure Laterality Date   ATRIAL FIBRILLATION ABLATION  2013   CORONARY ANGIOGRAM  2013   w/ angioplasty   CORONARY ANGIOGRAM  2010   CORONARY ARTERY BYPASS GRAFT  1993   x2   ILIAC ARTERY STENT  2005   LOWER EXTREMITY ANGIOGRAM Right 2017   PACEMAKER INSERTION  2013   PTCA  2015   multiple procedures last in 2015   TEMPORARY PACEMAKER N/A 12/09/2023   Procedure: TEMPORARY PACEMAKER;  Surgeon: [place], Peter M, MD;  Location: [facility] INVASIVE CV LAB;  Service: Cardiovascular;  Laterality: N/A;   VSD REPAIR  1974     Medications Prior to Admission  Medication Sig Dispense Refill Last Dose/Taking   potassium chloride  (KLOR-CON  M) 10 MEQ tablet Take 1 tablet (10 mEq total) by mouth every other day. (Patient not taking: Reported on 12/10/2023) 45 tablet 1 Not Taking    Inpatient Medications:   Chlorhexidine  Gluconate Cloth  6 each Topical Daily   pantoprazole  (PROTONIX ) IV  40 mg Intravenous Q12H   sodium chloride  flush  3 mL Intravenous Q12H    Allergies: No Known Allergies  Family History  Family history unknown: Yes     Physical Exam: Vitals:   12/10/23 0900 12/10/23 0915 12/10/23 0917 12/10/23 0930  BP: (!) 85/61     Pulse: 80 76  80  Resp: 16 16 16 16   Temp:   97.6 F (36.4 C)   TempSrc:   Axillary   SpO2: 100% 100%  100%  Weight:      Height:        GEN- This pt is sedated and encephalopathic in the setting of severe dementia and sedation.  Frail, cachectic. Lungs-  Diminished basilar sounds.  Heart- Regular rate and rhythm, no murmurs, rubs or gallops GI-  soft Extremities- no clubbing or cyanosis. No edema Skin- no rash or lesion Neuro - Responds to painful stimuli per report.    Radiology/Studies: DG Chest Port 1 View Result Date: 12/10/2023 CLINICAL DATA:  Aspiration into airway EXAM: PORTABLE CHEST 1 VIEW COMPARISON:  12/09/2023 FINDINGS: Interval placement of right IJ central venous catheter with tip over the mid/lower SVC. Slightly low lung volumes. Increased left basilar and left midlung opacities. Prominence of the central pulmonary vasculature. Additional patchy opacities in the right lung base. Left pleural effusion is increased. Possible trace right pleural effusion. No pneumothorax. Similar appearance of the cardiomediastinal silhouette. IMPRESSION: Increased left basilar and left midlung opacities concerning for aspiration/infiltrate. Left pleural effusion is slightly increased. Possible trace right pleural effusion. Similar cardiomegaly with increased pulmonary vascular congestion. Electronically Signed   By: Donnice Mania M.D.   On: 12/10/2023 08:45   CARDIAC CATHETERIZATION Result Date: 12/09/2023 Successful placement of temporary transvenous pacemaker.   CT Head Wo Contrast Result Date: 12/09/2023 CLINICAL DATA:  Neck trauma (Age >= 65y); Head trauma, minor (Age >= 65y) EXAM: CT HEAD WITHOUT CONTRAST CT CERVICAL SPINE WITHOUT CONTRAST TECHNIQUE: Multidetector CT imaging of the head and cervical spine was performed following the standard protocol without intravenous contrast. Multiplanar CT image reconstructions of the cervical spine were also generated. RADIATION DOSE REDUCTION: This exam was performed according to the departmental dose-optimization program which includes automated exposure control, adjustment of the mA and/or kV according to patient size and/or use of iterative reconstruction technique. COMPARISON:  None Available. FINDINGS: CT HEAD FINDINGS Brain: Cerebral ventricle sizes are concordant with the degree of cerebral volume  loss. Patchy and confluent areas of decreased attenuation are noted throughout the deep and periventricular white matter of the cerebral hemispheres bilaterally, compatible with chronic microvascular ischemic disease. No evidence of large-territorial acute infarction. No parenchymal hemorrhage. No mass lesion. No extra-axial collection. No mass effect or midline shift. No hydrocephalus. Basilar cisterns are patent. Vascular: No hyperdense vessel. Atherosclerotic calcifications are present within the cavernous internal carotid arteries. Skull: No acute fracture or focal lesion. Sinuses/Orbits: Paranasal sinuses and mastoid air cells are clear. The orbits are unremarkable. Other: None. CT CERVICAL SPINE FINDINGS Alignment: Normal. Skull base and vertebrae: Multilevel severe degenerative changes spine most prominent at the C5-C7 levels. No associated severe osseous neural foraminal or central canal stenosis. No acute fracture. No aggressive appearing focal osseous lesion or focal pathologic process. Soft tissues and spinal canal: No prevertebral fluid or swelling. No visible canal hematoma. Upper chest: Severe centrilobular and paraseptal emphysematous changes. At least trace right pleural effusion. Other: Atherosclerotic plaque. IMPRESSION: 1. No acute intracranial abnormality. 2. No acute displaced fracture or traumatic listhesis of the cervical spine.  3. Aortic Atherosclerosis (ICD10-I70.0) and Emphysema (ICD10-J43.9). Electronically Signed   By: Morgane  Naveau M.D.   On: 12/09/2023 17:57   CT Cervical Spine Wo Contrast Result Date: 12/09/2023 CLINICAL DATA:  Neck trauma (Age >= 65y); Head trauma, minor (Age >= 65y) EXAM: CT HEAD WITHOUT CONTRAST CT CERVICAL SPINE WITHOUT CONTRAST TECHNIQUE: Multidetector CT imaging of the head and cervical spine was performed following the standard protocol without intravenous contrast. Multiplanar CT image reconstructions of the cervical spine were also generated. RADIATION  DOSE REDUCTION: This exam was performed according to the departmental dose-optimization program which includes automated exposure control, adjustment of the mA and/or kV according to patient size and/or use of iterative reconstruction technique. COMPARISON:  None Available. FINDINGS: CT HEAD FINDINGS Brain: Cerebral ventricle sizes are concordant with the degree of cerebral volume loss. Patchy and confluent areas of decreased attenuation are noted throughout the deep and periventricular white matter of the cerebral hemispheres bilaterally, compatible with chronic microvascular ischemic disease. No evidence of large-territorial acute infarction. No parenchymal hemorrhage. No mass lesion. No extra-axial collection. No mass effect or midline shift. No hydrocephalus. Basilar cisterns are patent. Vascular: No hyperdense vessel. Atherosclerotic calcifications are present within the cavernous internal carotid arteries. Skull: No acute fracture or focal lesion. Sinuses/Orbits: Paranasal sinuses and mastoid air cells are clear. The orbits are unremarkable. Other: None. CT CERVICAL SPINE FINDINGS Alignment: Normal. Skull base and vertebrae: Multilevel severe degenerative changes spine most prominent at the C5-C7 levels. No associated severe osseous neural foraminal or central canal stenosis. No acute fracture. No aggressive appearing focal osseous lesion or focal pathologic process. Soft tissues and spinal canal: No prevertebral fluid or swelling. No visible canal hematoma. Upper chest: Severe centrilobular and paraseptal emphysematous changes. At least trace right pleural effusion. Other: Atherosclerotic plaque. IMPRESSION: 1. No acute intracranial abnormality. 2. No acute displaced fracture or traumatic listhesis of the cervical spine. 3. Aortic Atherosclerosis (ICD10-I70.0) and Emphysema (ICD10-J43.9). Electronically Signed   By: Morgane  Naveau M.D.   On: 12/09/2023 17:57   CT Angio Abd/Pel W and/or Wo Contrast Result  Date: 12/09/2023 CLINICAL DATA:  Abdominal aortic aneurysm (AAA), follow up EXAM: CTA ABDOMEN AND PELVIS WITHOUT AND WITH CONTRAST TECHNIQUE: Multidetector CT imaging of the abdomen and pelvis was performed using the standard protocol during bolus administration of intravenous contrast. Multiplanar reconstructed images and MIPs were obtained and reviewed to evaluate the vascular anatomy. RADIATION DOSE REDUCTION: This exam was performed according to the departmental dose-optimization program which includes automated exposure control, adjustment of the mA and/or kV according to patient size and/or use of iterative reconstruction technique. CONTRAST:  75mL OMNIPAQUE  IOHEXOL  350 MG/ML SOLN COMPARISON:  None Available. 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 Mass effect onto the IVC noted. No fistulization with the IVC identified. No dissection identified. The aneurysm terminates 1 cm before the aortic bifurcation. No periaortic fat stranding. No stenosis. Severe atherosclerotic plaque. No draping sign of the aorta along the lumbar spine. Celiac: Moderate atherosclerotic plaque. Patent without evidence of aneurysm, dissection, vasculitis or significant stenosis. SMA: Moderate atherosclerotic plaque. Patent without evidence of aneurysm, dissection, vasculitis or significant stenosis. Renals: Moderate severe atherosclerotic plaque. Both renal arteries are patent without evidence of aneurysm, dissection, vasculitis, fibromuscular dysplasia or significant stenosis. IMA: Patent without evidence of aneurysm, dissection, vasculitis or significant stenosis. Inflow: Moderate severe atherosclerotic plaque. Patent without evidence of aneurysm, dissection, vasculitis or significant  stenosis. Proximal Outflow: Severe atherosclerotic plaque. Right common femoral artery stent with evaluation of  patency limited due to timing of contrast. Otherwise bilateral common femoral and visualized portions of the superficial and profunda femoral arteries are patent without evidence of aneurysm, dissection, vasculitis or significant stenosis. Veins: Retrograde reflux of contrast into the inferior vena cava and hepatic veins. Otherwise no obvious venous abnormality within the limitations of this arterial phase study. Review of the MIP images confirms the above findings. NON-VASCULAR Lower chest: Small hiatal hernia. Cardiomegaly. Cardiac leads partially visualized. Mitral annular calcification. Aortic valve leaflet calcification. Coronary artery calcification. Bilateral small pleural effusions. Passive atelectasis of the lower lung zones. Hepatobiliary: No focal liver abnormality. Question calcified gallstones within the gallbladder lumen. Findings suggestive of associated gallbladder wall thickening. No pericholecystic fluid. No biliary dilatation. Pancreas: No focal lesion. Normal pancreatic contour. No surrounding inflammatory changes. No main pancreatic ductal dilatation. Spleen: Normal in size without focal abnormality. Adrenals/Urinary Tract: No adrenal nodule bilaterally. Bilateral kidneys enhance symmetrically. No hydronephrosis. No hydroureter. The urinary bladder is unremarkable. Stomach/Bowel: Stomach is within normal limits. No evidence of bowel wall thickening or dilatation. Appendix appears normal. Lymphatic: No lymphadenopathy. Reproductive: Prostate is unremarkable. Other: No intraperitoneal free fluid. No intraperitoneal free gas. No organized fluid collection. Musculoskeletal: No abdominal wall hernia.  Diffuse subcutaneus soft tissue edema. No suspicious lytic or blastic osseous lesions. No acute displaced fracture. Multilevel degenerative changes of the spine. IMPRESSION: VASCULAR 1. 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 Associated mass effect onto the IVC with no visualization identified. Recommend referral to or continued care with vascular specialist. (Ref.: J Vasc Surg. 2018; 67:2-77 and J Am Coll Radiol 2013;10(10):789-794.) 2. Aortic Atherosclerosis (ICD10-I70.0) including coronary artery, mitral annular, aortic valve leaflet calcifications-correlate for aortic stenosis. 3. Right common femoral artery stent with evaluation of patency limited due to timing of contrast. NON-VASCULAR 1. Question cholelithiasis. Nonspecific gallbladder wall thickening. Correlate with liver function test and consider right upper quadrant ultrasound for a more sensitive evaluation of the gallbladder 2. Colonic diverticulosis with no acute diverticulitis. 3. Small hiatal hernia. 4. Bilateral small pleural effusions. 5. Cardiomegaly. Electronically Signed   By: Morgane  Naveau M.D.   On: 12/09/2023 17:53   DG Chest Portable 1 View Result Date: 12/09/2023 CLINICAL DATA:  Syncope. EXAM: PORTABLE CHEST 1 VIEW COMPARISON:  Chest radiograph dated 08/04/2022. FINDINGS: There is cardiomegaly with mild vascular congestion. Faint bilateral interstitial densities may represent edema or atypical pneumonia. Small left pleural effusion and left lung base atelectasis or infiltrate. No pneumothorax. Stable cardiomegaly. Median sternotomy wires and CABG vascular clips. Left pectoral pacemaker device. No acute osseous pathology. IMPRESSION: 1. Cardiomegaly with mild vascular congestion. 2. Small left pleural effusion and left lung base atelectasis or infiltrate. Electronically Signed   By: Vanetta Chou M.D.   On: 12/09/2023 15:41    EKG: on arrival showed AF with junctional rhythm in the 30s (personally reviewed)  TELEMETRY: V pacing.  Brady/Tachy episodes from last night reviewed which show PMVT/Torsades in the setting of severe brady (personally reviewed)  DEVICE HISTORY: Dual Chamber Abbott device implanted 2013 for SSS  at outside facility.  Assessment/Plan:  Pacemaker at end of Battery Life SSS / Bradycardia In setting of PPM at EOS due to loss of follow up.  Will need gen change once he is otherwise medically stable; Could consider acutely if TVP becomes unstable, but not ideal with  increased risk of complication/infection in that setting.   GI bleeding Coffee ground emesis per EMS and RN FOBT (-) thus far.  GI consult pending.   PAF Previously on Xarelto, but per reports had been off all meds for up to a year.   Dementia Agitation/Sundowning Per primary.  Decisions will primarily need to be made by family.  Wife is pending discharge from [facility] for goals of care discussions.   GOC Would recommend GOC with significant Dementia and sundowning.  Per RN, plan had been to move pt and his wife to care facility in [place].   For questions or updates, please contact [facility] Please consult [email or web address] for contact info under     Signed, Ozell Prentice Passey, PA-C  12/10/2023, 9:45 AM

## 2023-12-11 DIAGNOSIS — R001 Bradycardia, unspecified: Secondary | ICD-10-CM | POA: Diagnosis not present

## 2023-12-11 DIAGNOSIS — Z515 Encounter for palliative care: Secondary | ICD-10-CM

## 2023-12-11 DIAGNOSIS — K922 Gastrointestinal hemorrhage, unspecified: Secondary | ICD-10-CM | POA: Diagnosis not present

## 2023-12-11 DIAGNOSIS — Z950 Presence of cardiac pacemaker: Secondary | ICD-10-CM | POA: Diagnosis not present

## 2023-12-11 DIAGNOSIS — E119 Type 2 diabetes mellitus without complications: Secondary | ICD-10-CM | POA: Diagnosis not present

## 2023-12-11 MED ORDER — MORPHINE SULFATE (PF) 2 MG/ML IV SOLN
1.0000 mg | INTRAVENOUS | Status: DC | PRN
  Administered 2023-12-11 – 2023-12-14 (×17): 2 mg via INTRAVENOUS
  Filled 2023-12-11 (×18): qty 1

## 2023-12-11 MED ORDER — MORPHINE SULFATE (PF) 2 MG/ML IV SOLN: 1.0000 mg | INTRAVENOUS | Status: DC | PRN

## 2023-12-11 MED ORDER — MIDAZOLAM HCL 2 MG/2ML IJ SOLN: 1.0000 mg | INTRAMUSCULAR | Status: DC | PRN

## 2023-12-11 MED ORDER — POLYVINYL ALCOHOL 1.4 % OP SOLN
1.0000 [drp] | Freq: Four times a day (QID) | OPHTHALMIC | Status: DC | PRN
Start: 2023-12-11 — End: 2023-12-17

## 2023-12-11 MED ORDER — MIDAZOLAM HCL 2 MG/2ML IJ SOLN
0.5000 mg | INTRAMUSCULAR | Status: DC | PRN
  Administered 2023-12-11 – 2023-12-14 (×7): 1 mg via INTRAVENOUS
  Filled 2023-12-11 (×8): qty 2

## 2023-12-11 NOTE — Plan of Care (Addendum)
   Palliative Medicine Inpatient Follow Up Note   I went to [facility] with anticipation of seeing Griffith Santilli, though when walking to his room I ran into [place], CCM APP who no longer needs the support of the PMT.   Family decisions towards comfort have been made and goals are clear.  Consultation will be removed.  The PMT will remain available as needed.   No Charge ______________________________________________________________________________________ Rosaline Becton  Palliative Medicine Team Team Cell Phone: [phone] Please utilize secure chat with additional questions, if there is no response within 30 minutes please call the above phone number  Palliative Medicine Team providers are available by phone from 7am to 7pm daily and can be reached through the team cell phone.  Should this patient require assistance outside of these hours, please call the patient's attending physician.

## 2023-12-11 NOTE — Progress Notes (Signed)
 Chaplain responded to spiritual consult request for prayer. I prayed for and with Manuel Vargas, along with his wife and family. Space was also created for story sharing and celebration of life. All expressed gratitude for chaplain presence, and we remain available.

## 2023-12-11 NOTE — Plan of Care (Signed)

## 2023-12-11 NOTE — Plan of Care (Signed)
  Problem: Education: Goal: Knowledge of the prescribed therapeutic regimen will improve Outcome: Progressing   Problem: Coping: Goal: Ability to identify and develop effective coping behavior will improve Outcome: Progressing   Problem: Clinical Measurements: Goal: Quality of life will improve to respect patient's wishes  Outcome: Progressing   Problem: Respiratory: Goal: Verbalizations of increased ease of respirations will increase Outcome: Progressing   Problem: Role Relationship: Goal: Family's ability to cope with current situation will improve Outcome: Progressing Goal: Ability to verbalize concerns, feelings, and thoughts to partner or family member will improve Outcome: Progressing   Problem: Pain Management: Goal: Satisfaction with pain management regimen will improve Outcome: Progressing

## 2023-12-11 NOTE — Progress Notes (Signed)
 Heart Failure Navigator Progress Note  Assessed for Heart & Vascular TOC clinic readiness.  Patient does not meet criteria due to per MD note has a history of Dementia. No HF TOC. .   Navigator will sign off at this time.   Stephane Haddock, BSN, Scientist, clinical (histocompatibility and immunogenetics) Only

## 2023-12-11 NOTE — Progress Notes (Addendum)
 TVP turned off per comfort care protocol and per family request.

## 2023-12-11 NOTE — Progress Notes (Signed)
 Patient to transition to comfort care, see MAR. Daughter's requesting that we wait for patient to fall asleep prior to turning of TVP and requesting that we give patient pain medicine prior. NP to bedside two separate times to discus care plan plan of care with patient's family. Answered all questions and concerns. Family in agreement and would like to proceed with comfort care.

## 2023-12-11 NOTE — Progress Notes (Signed)
 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 EMS called due to syncopal episode with subsequent emesis of black material as reported by wife. He was noted to be bradycardic. On PPM interrogation pacemaker not functioning appropriately.   Initial lab work revealed sodium of 141, K3.7, CL 110, CO2 20, glucose 121, creatinine 1.28, magnesium  1.9, AST 106, ALT 53, GFR 56, hemoglobin 8, HCT 25.5, platelets 256, PT 18, INR 1.4   Patient's EKG noted to be in junctional rhythm-heart rate 30s to 35 Patient received 1 dose of atropine  in the emergency department.  Cardiology/GI was consulted to evaluate patient.  Upon arrival to CCU, patient having bradycardic events with torsades.  Patient had TVP placed for bradycardia/torsades. Overnight patient developed severe sundowning-has history of dementia.  Patient started on Precedex  drip to assist with sundowning and agitation.     Pertinent  Medical History   Past Medical History:  Diagnosis Date   AAA (abdominal aortic aneurysm) (HCC)    Anemia    Atrial fibrillation (HCC)    Blood transfusion without reported diagnosis    CAD (coronary artery disease)    Dementia (HCC)    Diverticulosis of colon    GERD (gastroesophageal reflux disease)    Hepatic steatosis    History of malaria    Hyperlipidemia    Hypertension    Type 2 diabetes mellitus (HCC)    VSD (ventricular septal defect)      Significant Hospital Events: Including procedures, antibiotic start and stop dates in addition to other pertinent events   -7/21 admitted with bradycardia/junctional  rhythm-developed torsades, needing TVP placed.  Developed severe sundowning needing Precedex  overnight 7/22  Interim History / Subjective:  On precedex  overnight, now off, s/p morphine  for some back pain [place] 5L No complaints, plans to transition to comfort care later day once all of family arrives  Objective    Blood pressure 112/61, pulse 80, temperature 97.9 F (36.6 C), temperature source Axillary, resp. rate 19, height 5' 10 (1.778 m), weight 57.7 kg, SpO2 97%.        Intake/Output Summary (Last 24 hours) at 12/11/2023 0916 Last data filed at 12/11/2023 0600 Gross per 24 hour  Intake 352.63 ml  Output 75 ml  Net 277.63 ml   Filed Weights   12/09/23 1512 12/09/23 1830  Weight: 77.1 kg 57.7 kg    Examination: General:  chronically ill and cachetic elderly male lying in bed in NAD HEENT: MM pink/dry, poor dentition, pupils 3/r Neuro: easily awakens to verbal/ light touch, oriented to self, otherwise confused, follows intermittent simple commands CV: vpaced 80, VVI, 5 mA PULM:  non labored, diminished on left, coarse on right, [place] 5L GI: soft, bs+, NT, [place] Extremities: warm/dry, muscle wasting, no edema  Skin: no rashes    Resolved problem list   Assessment and Plan  Symptomatic bradycardia Cardiogenic syncope S/P pacemaker History of A-fib - Presumed malfunction of pacemaker Noted with heart rate within the 30s/junctional rhythm-developing torsades TVP placed by cardiology on 7/21. P: - cont TVP for now pending GOC  Hematemsis/GI bleed Anemia  - During syncopal event prompting admission, patient noted  to have dark vomiting Fecal occult negative.  Hemoglobin noted to be 8/HCT 25.5, baseline~ 10-11, no evidence of bleeding thus far P: - appreciate GI input, no plans for intervention given GOC - cont PPI  - NPO  Dementia/delirium Dementia hx - Had episode of sundowning with severe agitation overnight on 7/21, needing precedex  for possible  sundowners Presumably not taking any medications per documentations P: - delirium precautions - precedex  if needed   Diabetes type 2 P: - cont supportive care  PAD CAD AAA Hyperlipidemia Hepatic steatosis  Per report the patient was all/not taking any prescribed medications P: - supportive care  GOC  Severe malnutrition/ debility Patient is malnourished/cachectic Apparently patient has been off medications for some time Per report from nursing staff-family wanted to get patient and wife back to [place] to set residence into a memory care facility.  With patient's current state do not believe this is possible.  Patient will need eventually permanent replaced and less goals of care can be established and a palliative approach be taken.  Currently no family at bedside will attempt to reach out later today on 7/22 P: - DNR/ DNI - plans to transition to full comfort when all of family arrived, awaiting a daughter from [place] today - comfort measures prn - cont ongoing supportive care  Best Practice (right click and Reselect all SmartList Selections daily)   Diet/type: NPO DVT prophylaxis SCD Pressure ulcer(s): N/A GI prophylaxis: PPI Lines: N/A Foley:  N/A Code Status:  full code Last date of multidisciplinary goals of care discussion pending on 7/22   Pending update 7/23  Labs   CBC: Recent Labs  Lab 12/09/23 1512 12/09/23 1949 12/10/23 0818  WBC 5.0 5.8 15.1*  NEUTROABS 4.0  --   --   HGB 8.0* 8.6* 7.8*  HCT 25.5* 28.2* 24.9*  MCV 93.8 94.0 92.6  PLT 256 267 236    Basic Metabolic Panel: Recent Labs  Lab 12/09/23 1512 12/09/23 1949 12/09/23 1952 12/10/23 0818  NA 141 143  --  139  K 3.7 4.1  --  3.9  CL 110 108  --  106  CO2 20* 18*  --  16*  GLUCOSE 121* 115*  --  111*  BUN 18 17  --  21  CREATININE 1.28* 1.21  --  1.36*  CALCIUM 9.5 9.7  --  9.0  MG 1.9 2.2  --  1.8  PHOS  --   --  2.4*  --    GFR: Estimated Creatinine Clearance: 33.6 mL/min  (A) (by C-G formula based on SCr of 1.36 mg/dL (H)). Recent Labs  Lab 12/09/23 1512 12/09/23 1949 12/10/23 0818  WBC 5.0 5.8 15.1*    Liver Function Tests: Recent Labs  Lab 12/09/23 1512 12/09/23 1949  AST 106* 228*  ALT 53* 115*  ALKPHOS 70 80  BILITOT 2.3* 2.0*  PROT 7.1 7.2  ALBUMIN 2.9* 3.0*   No results for input(s): LIPASE, AMYLASE in the last 168 hours. No results for input(s): AMMONIA in the last 168 hours.  ABG    Component Value Date/Time   TCO2 21 (L) 01/15/2022 0311     Coagulation Profile: Recent Labs  Lab 12/09/23 1512  INR 1.4*    Cardiac Enzymes: Recent Labs  Lab 12/09/23 1512  CKTOTAL 278    HbA1C: Hgb A1c MFr Bld  Date/Time Value Ref Range Status  07/26/2023 02:27 PM 5.5 <5.7 % of total Hgb Final    Comment:    For the  purpose of screening for the presence of diabetes: . <5.7%       Consistent with the absence of diabetes 5.7-6.4%    Consistent with increased risk for diabetes             (prediabetes) > or =6.5%  Consistent with diabetes . This assay result is consistent with a decreased risk of diabetes. . Currently, no consensus exists regarding use of hemoglobin A1c for diagnosis of diabetes in children. . According to American Diabetes Association (ADA) guidelines, hemoglobin A1c <7.0% represents optimal control in non-pregnant diabetic patients. Different metrics may apply to specific patient populations.  Standards of Medical Care in Diabetes(ADA). .     CBG: Recent Labs  Lab 12/09/23 1953  GLUCAP 122*   Allergies No Known Allergies   Home Medications  Prior to Admission medications   Medication Sig Start Date End Date Taking? Authorizing Provider  potassium chloride  (KLOR-CON  M) 10 MEQ tablet Take 1 tablet (10 mEq total) by mouth every other day. 07/30/23   Amon Aloysius BRAVO, MD     Critical care time: 30 mins        Lyle Pesa, MSN, AG-ACNP-BC [facility] 12/11/2023, 9:17  AM  See Amion for pager If no response to pager , please call [phone] until 7pm After 7:00 pm call Elink  [phone]

## 2023-12-12 DIAGNOSIS — Z950 Presence of cardiac pacemaker: Secondary | ICD-10-CM

## 2023-12-12 DIAGNOSIS — E119 Type 2 diabetes mellitus without complications: Secondary | ICD-10-CM | POA: Diagnosis not present

## 2023-12-12 DIAGNOSIS — K922 Gastrointestinal hemorrhage, unspecified: Secondary | ICD-10-CM

## 2023-12-12 DIAGNOSIS — R001 Bradycardia, unspecified: Secondary | ICD-10-CM | POA: Diagnosis not present

## 2023-12-12 NOTE — Consult Note (Signed)
 Consultation Note Date: 12/12/2023   Patient Name: Manuel Vargas  DOB: 04-Apr-1941  MRN: [id number]  Age / Sex: 83 y.o., male  PCP: Amon Aloysius BRAVO, MD Referring Physician: Harold Scholz, MD  Reason for Consultation: Establishing goals of care  HPI/Patient Profile: 83 y.o. 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 On PPM interrogation pacemaker not functioning appropriately. Patient's EKG noted to be in junctional rhythm-heart rate 30s to 35. Upon arrival to CCU, patient having bradycardic events with torsades. Patient had TVP placed for bradycardia/torsades. Family made decision to transition to comfort care 7/23. PMT consulted to support patient and family.  Clinical Assessment and Goals of Care: I have reviewed medical records including EPIC notes, labs and imaging, received report from RN, assessed the patient and then met with his family to include wife and 2 daughters to discuss diagnosis prognosis, GOC, EOL wishes, disposition and options.  Of note, wife very hard of hearing and daughters report some cognitive impairment as well.  I introduced Palliative Medicine as specialized medical care for people living with serious illness. It focuses on providing relief from the symptoms and stress of a serious illness. The goal is to improve quality of life for both the patient and the family.  We reviewed goals for this patient - focus is on comfort and supporting him through the dying process. Family agrees, goals for comfort measures only are clear.   Family is very hopeful he will not be moved again - this was difficult on him moving from ICU to floor.   We review expectations in dying  process. Encouraged reading Gone from my sight booklet.  They are anxious about timing - we discussed uncertainty - hours -  days.   We reviewed meds we are using to promote comfort. We discussed escalation is comfort is not met with current regimen. They understand.   We discussed option of hospice facility however family very much prefers to keep him here through end of life to avoid any more transfers that seem difficult for him and also avoid risk of him passing in ambulance.   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 The following aspects are pertinent.  Past Medical History:  Diagnosis Date   AAA (abdominal aortic aneurysm) (HCC)    Anemia    Atrial fibrillation (HCC)    Blood transfusion without reported diagnosis    CAD (coronary artery disease)  Dementia (HCC)    Diverticulosis of colon    GERD (gastroesophageal reflux disease)    Hepatic steatosis    History of malaria    Hyperlipidemia    Hypertension    Type 2 diabetes mellitus (HCC)    VSD (ventricular septal defect)     Social History   Socioeconomic History   Marital status: Married    Spouse name: Not on file   Number of children: Not on file   Years of education: Not on file   Highest education level: Not on file  Occupational History   Occupation: retired; Comptroller  Tobacco Use   Smoking status: Former    Types: Cigarettes   Smokeless tobacco: Never   Tobacco comments:    1 PPD x years, quit ~2009  Substance and Sexual Activity   Alcohol  use: Yes    Alcohol /week: 7.0 - 9.0 standard drinks of alcohol     Types: 5 - 6 Glasses of wine, 2 - 3 Shots of liquor per week    Comment: ETOH most day   Drug use: Never   Sexual activity: Not on file  Other Topics Concern   Not on file  Social History Narrative   Born in [place] from a [place] familiy   Moved to the [place]  in the 60s   Has 1 daughter    Social Drivers of Corporate investment banker Strain: Not on file  Food Insecurity: Patient Unable To Answer (12/10/2023)   Hunger Vital Sign    Worried About Running Out of Food in the Last Year: Patient unable to answer    Ran Out of Food in the Last Year: Patient unable to answer  Transportation Needs: Patient Unable To Answer (12/10/2023)   PRAPARE - Transportation    Lack of Transportation (Medical): Patient unable to answer    Lack of Transportation (Non-Medical): Patient unable to answer  Physical Activity: Not on file  Stress: Not on file  Social Connections: Unknown (12/10/2023)   Social Connection and Isolation Panel    Frequency of Communication with Friends and Family: Patient unable to answer    Frequency of Social Gatherings with Friends and Family: Patient unable to answer    Attends Religious Services: Patient unable to answer    Active Member of Clubs or Organizations: Not on file    Attends Banker Meetings: Patient unable to answer    Marital Status: Patient unable to answer   Family History  Family history unknown: Yes   Scheduled Meds:   Chlorhexidine  Gluconate Cloth  6 each Topical Daily   feeding supplement  237 mL Oral BID BM   pantoprazole  (PROTONIX ) IV  40 mg Intravenous Q12H   sodium chloride  flush  3 mL Intravenous Q12H   Continuous Infusions: PRN Meds:.acetaminophen  **OR** acetaminophen , albuterol , artificial tears, glycopyrrolate , midazolam , morphine  injection, ondansetron  (ZOFRAN ) IV No Known Allergies Review of Systems  Unable to perform ROS: Patient unresponsive    Physical Exam Constitutional:      General: He is not in acute distress.    Appearance: He is ill-appearing.     Comments: unrseposnive  Cardiovascular:     Comments: Pulse difficult to palpate - slow and weak Pulmonary:     Effort: Pulmonary effort is normal.  Skin:    General: Skin is warm and dry.     Vital Signs: BP 126/69   Pulse (!) 47   Temp 97.9 F (36.6 C) (Axillary)   Resp 20   Ht 5'  10 (1.778 m)   Wt 57.7 kg   SpO2 100%   BMI 18.25 kg/m  Pain Scale: CPOT POSS *See Group Information*: S-Acceptable,Sleep, easy to arouse Pain Score: Asleep   SpO2: SpO2:  (sat probe removed for pt and family comfort) O2 Device:SpO2:  (sat probe removed for pt and family comfort) O2 Flow Rate: .O2 Flow Rate (L/min): 6 L/min  IO: Intake/output summary:  Intake/Output Summary (Last 24 hours) at 12/12/2023 1313 Last data filed at 12/12/2023 1100 Gross per 24 hour  Intake 77.67 ml  Output 400 ml  Net -322.33 ml    LBM: Last BM Date :  (PTA) Baseline Weight: Weight: 77.1 kg Most recent weight: Weight: 57.7 kg     Palliative Assessment/Data: PPS 10%     *Please note that this is a verbal dictation therefore any spelling or grammatical errors are due to the Dragon Medical One system interpretation.   Time Total: 70 minutes Time spent includes: Detailed review of medical records (labs, imaging, vital signs), medically appropriate exam, discussion with treatment team, counseling and educating patient, family and/or staff,  documenting clinical information, medication management and coordination of care.    Tobey Jama Barnacle, DNP, AGNP-C Palliative Medicine Team [phone] Pager: [phone]

## 2023-12-12 NOTE — Plan of Care (Signed)
  Problem: Pain Managment: Goal: General experience of comfort will improve and/or be controlled Outcome: Progressing   Problem: Safety: Goal: Ability to remain free from injury will improve Outcome: Progressing   Problem: Education: Goal: Knowledge of the prescribed therapeutic regimen will improve Outcome: Progressing   Problem: Respiratory: Goal: Verbalizations of increased ease of respirations will increase Outcome: Progressing   Problem: Role Relationship: Goal: Family's ability to cope with current situation will improve Outcome: Progressing   Problem: Role Relationship: Goal: Ability to verbalize concerns, feelings, and thoughts to partner or family member will improve Outcome: Progressing   Problem: Pain Management: Goal: Satisfaction with pain management regimen will improve Outcome: Progressing   Problem: Education: Goal: Knowledge of the prescribed therapeutic regimen will improve Outcome: Progressing   Problem: Coping: Goal: Ability to identify and develop effective coping behavior will improve Outcome: Progressing

## 2023-12-12 NOTE — Progress Notes (Signed)
 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 EMS called due to syncopal episode with subsequent emesis of black material as reported by wife. He was noted to be bradycardic. On PPM interrogation pacemaker not functioning appropriately.   Initial lab work revealed sodium of 141, K3.7, CL 110, CO2 20, glucose 121, creatinine 1.28, magnesium  1.9, AST 106, ALT 53, GFR 56, hemoglobin 8, HCT 25.5, platelets 256, PT 18, INR 1.4   Patient's EKG noted to be in junctional rhythm-heart rate 30s to 35 Patient received 1 dose of atropine  in the emergency department.  Cardiology/GI was consulted to evaluate patient.  Upon arrival to CCU, patient having bradycardic events with torsades.  Patient had TVP placed for bradycardia/torsades. Overnight patient developed severe sundowning-has history of dementia.  Patient started on Precedex  drip to assist with sundowning and agitation.  Pertinent  Medical History   Past Medical History:  Diagnosis Date   AAA (abdominal aortic aneurysm) (HCC)    Anemia    Atrial fibrillation (HCC)    Blood transfusion without reported diagnosis    CAD (coronary artery disease)    Dementia (HCC)    Diverticulosis of colon    GERD (gastroesophageal reflux disease)    Hepatic steatosis    History of malaria    Hyperlipidemia    Hypertension    Type 2 diabetes mellitus (HCC)    VSD (ventricular septal defect)      Significant Hospital Events: Including procedures, antibiotic start and stop dates in addition to other pertinent events   -7/21 admitted with bradycardia/junctional  rhythm-developed torsades, needing TVP placed.  Developed severe sundowning needing Precedex  overnight 7/23 transitioned to comfort  Interim History / Subjective:  Off precedex  this am, comfortable.  TVP pulled this am.  Family remains at bedside  Objective    Blood pressure 126/69, pulse (!) 47, temperature 97.9 F (36.6 C), temperature source Axillary, resp. rate (!) 22, height 5' 10 (1.778 m), weight 57.7 kg, SpO2 100%.        Intake/Output Summary (Last 24 hours) at 12/12/2023 1104 Last data filed at 12/12/2023 0800 Gross per 24 hour  Intake 197.67 ml  Output 0 ml  Net 197.67 ml   Filed Weights   12/09/23 1512 12/09/23 1830  Weight: 77.1 kg 57.7 kg    Examination: General:  frail and cachetic elderly male lying in bed, NAD Neuro: mostly unresponsive, grabbed my hand when palpating bladder, not opening eyes, f/c CV: rrir, distant.  HR in 30s- junctional, occasionally HB, no distal pulses PULM:  non labored, regular, no upper airway congestion GI: firmness and tenderness to lower abd palpation Extremities: warm/dry, slightly cooler in hands/ feet  No UOP in 2 days  Resolved problem list   Assessment and Plan  Symptomatic bradycardia Cardiogenic syncope S/P pacemaker History of A-fib - Presumed malfunction of pacemaker at battery end of life.  Noted with heart rate within the 30s/junctional rhythm-developing torsades.  TVP placed by cardiology on 7/21. Off 7/23 evening with transition to comfort, TVP removed 7/24 am Hematemsis/GI bleed Anemia  Dementia/delirium Diabetes type 2 PAD CAD AAA Hyperlipidemia Hepatic steatosis  Cachexia/ Severe malnutrition/  debility P: - DNR/ DNI, full comfort care.  HR remains in 30's after TVP stopped and has remained mostly unresponsive, grimaces occasionally.  Will transfer to palliative floor today - cont comfort measures> prn robinul , morphine , versed  for agitation - ongoing family support.  Family would like support of PMT to  re-engage, reconsult placed.  Have asked about possibly transfer to hospice center but given HR and mental status > would not recommend at this time, anticipate hospital passing - bladder scan for suspected urinary retention in end of life> place foley if > 400  Best Practice (right click and Reselect all SmartList Selections daily)   Diet/type: NPO DVT prophylaxis SCD Pressure ulcer(s): N/A GI prophylaxis: PPI Lines: N/A Foley:  N/A Code Status:  full code Last date of multidisciplinary goals of care discussion 7/23  Daughters and wife updated at bedside.   Will transfer to [facility] for primary care of 7/25.   Labs   CBC: Recent Labs  Lab 12/09/23 1512 12/09/23 1949 12/10/23 0818  WBC 5.0 5.8 15.1*  NEUTROABS 4.0  --   --   HGB 8.0* 8.6* 7.8*  HCT 25.5* 28.2* 24.9*  MCV 93.8 94.0 92.6  PLT 256 267 236    Basic Metabolic Panel: Recent Labs  Lab 12/09/23 1512 12/09/23 1949 12/09/23 1952 12/10/23 0818  NA 141 143  --  139  K 3.7 4.1  --  3.9  CL 110 108  --  106  CO2 20* 18*  --  16*  GLUCOSE 121* 115*  --  111*  BUN 18 17  --  21  CREATININE 1.28* 1.21  --  1.36*  CALCIUM 9.5 9.7  --  9.0  MG 1.9 2.2  --  1.8  PHOS  --   --  2.4*  --    GFR: Estimated Creatinine Clearance: 33.6 mL/min (A) (by C-G formula based on SCr of 1.36 mg/dL (H)). Recent Labs  Lab 12/09/23 1512 12/09/23 1949 12/10/23 0818  WBC 5.0 5.8 15.1*    Liver Function Tests: Recent Labs  Lab 12/09/23 1512 12/09/23 1949  AST 106* 228*  ALT 53* 115*  ALKPHOS 70 80  BILITOT 2.3* 2.0*  PROT 7.1 7.2  ALBUMIN 2.9* 3.0*   No results for input(s): LIPASE, AMYLASE in the last 168 hours. No results for input(s): AMMONIA in the last 168 hours.  ABG    Component Value Date/Time   TCO2 21 (L) 01/15/2022 0311     Coagulation Profile: Recent Labs  Lab 12/09/23 1512  INR 1.4*    Cardiac Enzymes: Recent Labs  Lab 12/09/23 1512  CKTOTAL 278    HbA1C: Hgb A1c MFr Bld   Date/Time Value Ref Range Status  07/26/2023 02:27 PM 5.5 <5.7 % of total Hgb Final    Comment:    For the purpose of screening for the presence of diabetes: . <5.7%       Consistent with the absence of diabetes 5.7-6.4%    Consistent with increased risk for diabetes             (prediabetes) > or =6.5%  Consistent with diabetes . This assay result is consistent with a decreased risk of diabetes. . Currently, no consensus exists regarding use of hemoglobin A1c for diagnosis of diabetes in children. . According to American Diabetes Association (ADA) guidelines, hemoglobin A1c <7.0% represents optimal control in non-pregnant diabetic patients. Different metrics may apply to specific patient populations.  Standards of Medical Care in Diabetes(ADA). SABRA  CBG: Recent Labs  Lab 12/09/23 1953  GLUCAP 122*   Allergies No Known Allergies   Home Medications  Prior to Admission medications   Medication Sig Start Date End Date Taking? Authorizing Provider  potassium chloride  (KLOR-CON  M) 10 MEQ tablet Take 1 tablet (10 mEq total) by mouth every other day. 07/30/23   Amon Aloysius BRAVO, MD     Critical care time: n/a       Lyle Pesa, MSN, AG-ACNP-BC [facility] 12/12/2023, 11:04 AM  See Amion for pager If no response to pager , please call [phone] until 7pm After 7:00 pm call Elink  [phone]

## 2023-12-13 DIAGNOSIS — Z66 Do not resuscitate: Secondary | ICD-10-CM

## 2023-12-13 DIAGNOSIS — R001 Bradycardia, unspecified: Secondary | ICD-10-CM | POA: Diagnosis not present

## 2023-12-13 NOTE — Hospital Course (Addendum)
 20 yoM with long hx but significant for hx of dementia and progressive decline/ cachexia admitted after syncopal episode, possible GIB.  He was found to have malfunctioning pacemaker- battery at end of life, was in junctional 30's but then developed torsades/ vfib requiring emergent TVP on 7/21. Given pt's wishes>DNR/ DNI, no more procedures> he was transitioned to full comfort 7/23. remains now junctional in 30's, mainly unresponsive on prn comfort meds. Re-engaged PMT per family request to help manage. Patient continued on full comfort measures, Patient peacefully passed away on Jan 04, 2024 9 AM  Discharge diagnosis:   End-of-life care SSS/Symptomatic bradycardia/Cardiogenic syncope Pacemaker at end of battery life Junctional rhythm and torsades. S/p TVP placed by cardiology on 7/21>, TVP removed 7/24 am Paroxysmal A-fib Hematemsis/GI bleed Anemia  Dementia/delirium/agitation sundowning Diabetes type 2 PAD CAD AAA Hyperlipidemia Hepatic steatosis  Cachexia/ Severe malnutrition/ debility:

## 2023-12-13 NOTE — Plan of Care (Signed)
  Problem: Education: Goal: Knowledge of General Education information will improve Description: Including pain rating scale, medication(s)/side effects and non-pharmacologic comfort measures Outcome: Not Applicable   Problem: Health Behavior/Discharge Planning: Goal: Ability to manage health-related needs will improve Outcome: Not Applicable   Problem: Clinical Measurements: Goal: Ability to maintain clinical measurements within normal limits will improve Outcome: Not Applicable Goal: Will remain free from infection Outcome: Not Applicable Goal: Diagnostic test results will improve Outcome: Not Applicable Goal: Respiratory complications will improve Outcome: Not Applicable Goal: Cardiovascular complication will be avoided Outcome: Not Applicable   Problem: Activity: Goal: Risk for activity intolerance will decrease Outcome: Not Applicable   Problem: Nutrition: Goal: Adequate nutrition will be maintained Outcome: Not Applicable   Problem: Coping: Goal: Level of anxiety will decrease Outcome: Not Applicable   Problem: Elimination: Goal: Will not experience complications related to bowel motility Outcome: Not Applicable Goal: Will not experience complications related to urinary retention Outcome: Not Applicable   Problem: Pain Managment: Goal: General experience of comfort will improve and/or be controlled Outcome: Not Applicable   Problem: Safety: Goal: Ability to remain free from injury will improve Outcome: Not Applicable   Problem: Skin Integrity: Goal: Risk for impaired skin integrity will decrease Outcome: Not Applicable   Problem: Education: Goal: Knowledge of the prescribed therapeutic regimen will improve Outcome: Not Applicable   Problem: Coping: Goal: Ability to identify and develop effective coping behavior will improve Outcome: Not Applicable   Problem: Clinical Measurements: Goal: Quality of life will improve Outcome: Not Applicable    Problem: Respiratory: Goal: Verbalizations of increased ease of respirations will increase Outcome: Not Applicable   Problem: Role Relationship: Goal: Family's ability to cope with current situation will improve Outcome: Not Applicable Goal: Ability to verbalize concerns, feelings, and thoughts to partner or family member will improve Outcome: Not Applicable   Problem: Pain Management: Goal: Satisfaction with pain management regimen will improve Outcome: Not Applicable   Problem: Education: Goal: Knowledge of the prescribed therapeutic regimen will improve Outcome: Not Applicable   Problem: Coping: Goal: Ability to identify and develop effective coping behavior will improve Outcome: Not Applicable   Problem: Clinical Measurements: Goal: Quality of life will improve Outcome: Not Applicable   Problem: Respiratory: Goal: Verbalizations of increased ease of respirations will increase Outcome: Not Applicable   Problem: Role Relationship: Goal: Family's ability to cope with current situation will improve Outcome: Not Applicable Goal: Ability to verbalize concerns, feelings, and thoughts to partner or family member will improve Outcome: Not Applicable   Problem: Pain Management: Goal: Satisfaction with pain management regimen will improve Outcome: Not Applicable

## 2023-12-13 NOTE — Plan of Care (Signed)

## 2023-12-13 NOTE — Care Management Important Message (Signed)
 Important Message  Patient Details  Name: Manuel Vargas MRN: [id number] Date of Birth: [birth year]   Important Message Given:  Yes - Medicare IM     Claretta Deed 12/13/2023, 4:19 PM

## 2023-12-13 NOTE — Progress Notes (Signed)
 PROGRESS NOTE Manuel Vargas  FMW:[id number] DOB: May 14, 1941 DOA: 12/09/2023 PCP: Amon Aloysius BRAVO, MD  Brief Narrative/Hospital Course: 27 yoM with long hx but significant for hx of dementia and progressive decline/ cachexia admitted after syncopal episode, possible GIB.  He was found to have malfunctioning pacemaker- battery at end of life, was in junctional 30's but then developed torsades/ vfib requiring emergent TVP on 7/21. Given pt's wishes>DNR/ DNI, no more procedures> he was transitioned to full comfort 7/23. remains now junctional in 30's, mainly unresponsive on prn comfort meds. Re-engaged PMT per family request to help manage.  Subjective: Seen and examined Patient remains unresponsive Wife at the bedside hard of hearing including other family members Bradycardic in 40s BP 110s overnight Remains on comfort care  Assessment and plan:  End-of-life care Symptomatic bradycardia/Presumed malfunction of pacemaker at battery end of life Junctional rhythm and torsades. S/p TVP placed by cardiology on 7/21>, TVP removed 7/24 am Cardiogenic syncope S/P pacemaker History of A-fib Hematemsis/GI bleed Anemia  Dementia/delirium Diabetes type 2 PAD CAD AAA Hyperlipidemia Hepatic steatosis  Cachexia/ Severe malnutrition/ debility: Plan: Active care has been consulted.  Continue DNR/DNI continue current full comfort care measures for end-of-life care Continue supportive care chaplain care Anticipating hospital death Palliative care following closely appreciate input   Body mass index is 18.25 kg/m.:  DVT prophylaxis: SCDs Start: 12/09/23 1826 Code Status:   Code Status: Do not attempt resuscitation (DNR) - Comfort care Family Communication: plan of care discussed with patient/family at bedside. Patient status is: Remains hospitalized because of severity of illness Level of care: Palliative Care   Dispo: The patient is from: home            Anticipated disposition: TBD-hospice versus  anticipated hospital death Objective: Vitals last 24 hrs: Vitals:   12/12/23 1100 12/12/23 1551 12/12/23 2216 12/13/23 0913  BP:  (!) 148/46 (!) 111/33 (!) 139/49  Pulse:   (!) 49 (!) 42  Resp: 20     Temp:   99.5 F (37.5 C) 99.3 F (37.4 C)  TempSrc:   Oral Oral  SpO2:    (!) 87%  Weight:      Height:        Physical Examination: Full exam deferred, patient is unresponsive breathing spontaneously, breathing has slowed down  Data Reviewed: I have personally reviewed following labs and imaging studies ( see epic result tab) CBC: Recent Labs  Lab 12/09/23 1512 12/09/23 1949 12/10/23 0818  WBC 5.0 5.8 15.1*  NEUTROABS 4.0  --   --   HGB 8.0* 8.6* 7.8*  HCT 25.5* 28.2* 24.9*  MCV 93.8 94.0 92.6  PLT 256 267 236   CMP: Recent Labs  Lab 12/09/23 1512 12/09/23 1949 12/09/23 1952 12/10/23 0818  NA 141 143  --  139  K 3.7 4.1  --  3.9  CL 110 108  --  106  CO2 20* 18*  --  16*  GLUCOSE 121* 115*  --  111*  BUN 18 17  --  21  CREATININE 1.28* 1.21  --  1.36*  CALCIUM 9.5 9.7  --  9.0  MG 1.9 2.2  --  1.8  PHOS  --   --  2.4*  --    GFR: Estimated Creatinine Clearance: 33.6 mL/min (A) (by C-G formula based on SCr of 1.36 mg/dL (H)). Recent Labs  Lab 12/09/23 1512 12/09/23 1949  AST 106* 228*  ALT 53* 115*  ALKPHOS 70 80  BILITOT 2.3* 2.0*  PROT 7.1  7.2  ALBUMIN 2.9* 3.0*   No results for input(s): LIPASE, AMYLASE in the last 168 hours. No results for input(s): AMMONIA in the last 168 hours. Coagulation Profile:  Recent Labs  Lab 12/09/23 1512  INR 1.4*   Unresulted Labs (From admission, onward)    None      Antimicrobials/Microbiology: Anti-infectives (From admission, onward)    None      No results found for: SDES, SPECREQUEST, CULT, REPTSTATUS  Procedures: Procedure(s) (LRB): TEMPORARY PACEMAKER (N/A) Medications reviewed:  Scheduled Meds:  Chlorhexidine  Gluconate Cloth  6 each Topical Daily   sodium chloride  flush  3 mL  Intravenous Q12H   Continuous Infusions:  Mennie LAMY, MD [facility] 12/13/2023, 10:09 AM

## 2023-12-13 NOTE — Progress Notes (Signed)
 Daily Progress Note   Patient Name: Manuel Vargas       Date: 12/13/2023 DOB: [birth year]  Age: 83 y.o. MRN#: [id number] Attending Physician: Christobal Guadalajara, MD Primary Care Physician: Amon Aloysius BRAVO, MD Admit Date: 12/09/2023  Reason for Consultation/Follow-up: Establishing goals of care  Subjective: Patient unresponsive - multiple family members at bedside  Length of Stay: 4  Current Medications: Scheduled Meds:   Chlorhexidine  Gluconate Cloth  6 each Topical Daily   sodium chloride  flush  3 mL Intravenous Q12H    Continuous Infusions:   PRN Meds: acetaminophen  **OR** acetaminophen , artificial tears, glycopyrrolate , midazolam , morphine  injection, ondansetron  (ZOFRAN ) IV  Physical Exam Constitutional:      General: He is not in acute distress.    Appearance: He is ill-appearing.     Comments: Hot to touch unresponsive  Cardiovascular:     Comments: Bradycardic, pulse weak Pulmonary:     Effort: Pulmonary effort is normal.  Musculoskeletal:     Comments: Swollen extremities  Skin:    General: Skin is warm and dry.             Vital Signs: BP (!) 139/49   Pulse (!) 42   Temp 99.3 F (37.4 C) (Oral)   Resp 20   Ht 5' 10 (1.778 m)   Wt 57.7 kg   SpO2 (!) 87%   BMI 18.25 kg/m  SpO2: SpO2: (!) 87 % O2 Device: O2 Device: Room Air O2 Flow Rate: O2 Flow Rate (L/min): 6 L/min  Intake/output summary: No intake or output data in the 24 hours ending 12/13/23 1340 LBM: Last BM Date :  (PTA) Baseline Weight: Weight: 77.1 kg Most recent weight: Weight: 57.7 kg       Palliative Assessment/Data:PPS 10%      Patient Active Problem List   Diagnosis Date Noted   Palliative care by specialist 12/11/2023   Hematemesis 12/09/2023   Syncope 12/09/2023   Bradycardia 12/09/2023   Atrial  fibrillation (HCC) 08/22/2023   GERD (gastroesophageal reflux disease) 08/22/2023   Post PTCA 08/22/2023   AAA (abdominal aortic aneurysm) (HCC) 08/22/2023   Angina pectoris (HCC) 08/22/2023   Type 2 diabetes mellitus (HCC) 08/22/2023   Diverticulosis 08/22/2023   Hepatic steatosis 08/22/2023   Dyspnea 08/22/2023   Generalized edema 08/22/2023   Goals of care, counseling/discussion 07/27/2023   VSD (ventricular septal defect) 07/26/2023   Dementia (HCC) 07/26/2023   PVD (peripheral vascular disease) (HCC) 07/26/2023   Coronary atherosclerosis 04/09/2022   Deficiency of other specified B group vitamins 04/09/2022   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 On PPM interrogation pacemaker not functioning appropriately. Patient's EKG noted to be in junctional rhythm-heart rate 30s to 35. Upon arrival to CCU, patient having bradycardic events with torsades. Patient had TVP placed for bradycardia/torsades. Family made decision to transition to comfort care  7/23. PMT consulted to support patient and family.   Assessment: Follow-up today with patient and family. Patient continues with comfort measures only. Assessment consistent with end-of-life processes. Patient appears comfortable -breathing even and regular.  Pulse slow and weak. Medication administration reviewed. Continues with as needed meds only, no infusion needed.  Discussed with family and they are comfortable with plan. Educated family on what to expect moving forward.  They understand. Discussed what to look for as patient nears end-of-life.  Discussed  signs to look for discomfort.  Anticipate hospital death.  Recommendations/Plan: Continue comfort measures only, administer as needed medications to promote comfort Anticipate hospital death -family does not want to move him anymore  Goals of Care and Additional Recommendations: Limitations on Scope of Treatment: Full Comfort Care  Code Status: DNR  Prognosis:  Hours - Days  Discharge Planning: Anticipated Hospital Death  Care plan was discussed with patient's daughters and wife, Dr Christobal  Thank you for allowing the Palliative Medicine Team to assist in the care of this patient.   Total Time 45 minutes Prolonged Time Billed  no   Time spent includes: Detailed review of medical records (labs, imaging, vital signs), medically appropriate exam, discussion with treatment team, counseling and educating patient, family and/or staff, documenting clinical information, medication management and coordination of care.     *Please note that this is a verbal dictation therefore any spelling or grammatical errors are due to the Dragon [employer] One system interpretation.  Tobey Jama Barnacle, DNP, [facility] Palliative Medicine Team Team Phone # [phone]  Pager [phone]

## 2023-12-14 DIAGNOSIS — R001 Bradycardia, unspecified: Secondary | ICD-10-CM | POA: Diagnosis not present

## 2023-12-14 MED ORDER — MORPHINE SULFATE (PF) 2 MG/ML IV SOLN
2.0000 mg | INTRAVENOUS | Status: DC
  Administered 2023-12-14 – 2023-12-17 (×23): 2 mg via INTRAVENOUS
  Filled 2023-12-14 (×23): qty 1

## 2023-12-14 NOTE — Progress Notes (Signed)
 PROGRESS NOTE Manuel Vargas  FMW:[id number] DOB: 1940-06-08 DOA: 12/09/2023 PCP: Amon Aloysius BRAVO, MD  Brief Narrative/Hospital Course: 28 yoM with long hx but significant for hx of dementia and progressive decline/ cachexia admitted after syncopal episode, possible GIB.  He was found to have malfunctioning pacemaker- battery at end of life, was in junctional 30's but then developed torsades/ vfib requiring emergent TVP on 7/21. Given pt's wishes>DNR/ DNI, no more procedures> he was transitioned to full comfort 7/23. remains now junctional in 30's, mainly unresponsive on prn comfort meds. Re-engaged PMT per family request to help manage.  Subjective: Patient seen With daughter and family members at the bedside Appears uncomfortable just received morphine  Remains on comfort care  Assessment and plan:  End-of-life care Symptomatic bradycardia/Presumed malfunction of pacemaker at battery end of life Junctional rhythm and torsades. S/p TVP placed by cardiology on 7/21>, TVP removed 7/24 am Cardiogenic syncope S/P pacemaker History of A-fib Hematemsis/GI bleed Anemia  Dementia/delirium Diabetes type 2 PAD CAD AAA Hyperlipidemia Hepatic steatosis  Cachexia/ Severe malnutrition/ debility: Plan: Palliative care following closely for end-of-life care symptom management Morphine  has been changed to scheduled did not receive last night, he appeared uncomfortable Continue supportive care oral care Discussed with palliative care team this morning   Body mass index is 18.25 kg/m.:  DVT prophylaxis: SCDs Start: 12/09/23 1826 Code Status:   Code Status: Do not attempt resuscitation (DNR) - Comfort care Family Communication: plan of care discussed with patient/family at bedside. Patient status is: Remains hospitalized because of severity of illness Level of care: Palliative Care   Dispo: The patient is from: home            Anticipated disposition: TBD-hospice versus anticipated hospital  death Objective: Vitals last 24 hrs: Vitals:   12/12/23 1551 12/12/23 2216 12/13/23 0913 12/14/23 0743  BP: (!) 148/46 (!) 111/33 (!) 139/49 (!) 145/56  Pulse:  (!) 49 (!) 42 (!) 44  Resp:    18  Temp:  99.5 F (37.5 C) 99.3 F (37.4 C) 98 F (36.7 C)  TempSrc:  Oral Oral   SpO2:   (!) 87% (!) 84%  Weight:      Height:       Physical Examination: Minimally responsive Breathing somewhat rapidly, shallow Cool extremities full exam deferred for comfort  Data Reviewed: I have personally reviewed following labs and imaging studies ( see epic result tab) CBC: Recent Labs  Lab 12/09/23 1512 12/09/23 1949 12/10/23 0818  WBC 5.0 5.8 15.1*  NEUTROABS 4.0  --   --   HGB 8.0* 8.6* 7.8*  HCT 25.5* 28.2* 24.9*  MCV 93.8 94.0 92.6  PLT 256 267 236   CMP: Recent Labs  Lab 12/09/23 1512 12/09/23 1949 12/09/23 1952 12/10/23 0818  NA 141 143  --  139  K 3.7 4.1  --  3.9  CL 110 108  --  106  CO2 20* 18*  --  16*  GLUCOSE 121* 115*  --  111*  BUN 18 17  --  21  CREATININE 1.28* 1.21  --  1.36*  CALCIUM 9.5 9.7  --  9.0  MG 1.9 2.2  --  1.8  PHOS  --   --  2.4*  --    GFR: Estimated Creatinine Clearance: 33.6 mL/min (A) (by C-G formula based on SCr of 1.36 mg/dL (H)). Recent Labs  Lab 12/09/23 1512 12/09/23 1949  AST 106* 228*  ALT 53* 115*  ALKPHOS 70 80  BILITOT 2.3* 2.0*  PROT 7.1 7.2  ALBUMIN 2.9* 3.0*   No results for input(s): LIPASE, AMYLASE in the last 168 hours. No results for input(s): AMMONIA in the last 168 hours. Coagulation Profile:  Recent Labs  Lab 12/09/23 1512  INR 1.4*   Unresulted Labs (From admission, onward)    None      Antimicrobials/Microbiology: Anti-infectives (From admission, onward)    None      No results found for: SDES, SPECREQUEST, CULT, REPTSTATUS  Procedures: Procedure(s) (LRB): TEMPORARY PACEMAKER (N/A) Medications reviewed:  Scheduled Meds:  Chlorhexidine  Gluconate Cloth  6 each Topical Daily     morphine  injection  2 mg Intravenous Q3H   sodium chloride  flush  3 mL Intravenous Q12H   Continuous Infusions:  Mennie LAMY, MD [facility] 12/14/2023, 10:52 AM

## 2023-12-14 NOTE — Progress Notes (Addendum)
 Daily Progress Note   Patient Name: Manuel Vargas       Date: 12/14/2023 DOB: October 29, 1940  Age: 83 y.o. MRN#: [id number] Attending Physician: Christobal Guadalajara, MD Primary Care Physician: Amon Aloysius BRAVO, MD Admit Date: 12/09/2023  Reason for Consultation/Follow-up: Establishing goals of care   Length of Stay: 5  Current Medications: Scheduled Meds:   Chlorhexidine  Gluconate Cloth  6 each Topical Daily    morphine  injection  2 mg Intravenous Q3H   sodium chloride  flush  3 mL Intravenous Q12H    Continuous Infusions:   PRN Meds: acetaminophen  **OR** acetaminophen , artificial tears, glycopyrrolate , midazolam , morphine  injection, ondansetron  (ZOFRAN ) IV  Physical Exam Constitutional:      General: He is not in acute distress.    Appearance: He is ill-appearing.  HENT:     Mouth/Throat:     Mouth: Mucous membranes are dry.  Pulmonary:     Effort: Tachypnea present.     Comments: uneven Skin:    General: Skin is warm and dry.     Findings: Bruising present.             Vital Signs: BP (!) 145/56   Pulse (!) 44   Temp 98 F (36.7 C)   Resp 18   Ht 5' 10 (1.778 m)   Wt 57.7 kg   SpO2 (!) 84%   BMI 18.25 kg/m  SpO2: SpO2: (!) 84 % O2 Device: O2 Device: Room Air O2 Flow Rate: O2 Flow Rate (L/min): 6 L/min   Palliative Assessment/Data: 10%      Patient Active Problem List   Diagnosis Date Noted   Palliative care by specialist 12/11/2023   Hematemesis 12/09/2023   Syncope 12/09/2023   Bradycardia 12/09/2023   Atrial fibrillation (HCC) 08/22/2023   GERD (gastroesophageal reflux disease) 08/22/2023   Post PTCA 08/22/2023   AAA (abdominal aortic aneurysm) (HCC) 08/22/2023   Angina pectoris (HCC) 08/22/2023   Type 2 diabetes mellitus (HCC) 08/22/2023   Diverticulosis  08/22/2023   Hepatic steatosis 08/22/2023   Dyspnea 08/22/2023   Generalized edema 08/22/2023   Goals of care, counseling/discussion 07/27/2023   VSD (ventricular septal defect) 07/26/2023   Dementia (HCC) 07/26/2023   PVD (peripheral vascular disease) (HCC) 07/26/2023   Coronary atherosclerosis 04/09/2022   Deficiency of other specified B group vitamins 04/09/2022   Megaloblastic  anemia due to folic acid deficiency 04/09/2022    Palliative Care Assessment & Plan   Patient Profile: 83 y.o. 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 On PPM interrogation pacemaker not functioning appropriately. Patient's EKG noted to be in junctional rhythm-heart rate 30s to 35. Upon arrival to CCU, patient having bradycardic events with torsades. Patient had TVP placed for bradycardia/torsades. Family made decision to transition to comfort care 7/23. PMT consulted to support patient and family.   Today's Discussion: Reviewed chart. Patient remains on full comfort care. He received prn morphine , robinul , and versed  over the last 24 hours. Patient's wife at bedside initially and then his daughters arrived. Patient's wife shared stories about their love and thirty year marriage. Patient looks uncomfortable and his breathing is tachypneic. Messaged RN for prn morphine . Daughters asked about medication administration in the day shift vs night shift.  The patient received one as needed morphine  doses overnight. We discussed the option of scheduling the patient's morphine  and agreed to scheduling it every 3 hours. We discussed keeping the prn morphine  for breakthrough pain or dyspnea. Daughters express their gratitude for this change. Discussed other comfort  measures including mouth care. Educated daughters on what to expect moving forward as the patient gets closer to end of life. We discussed the plan for a hospital death. Emotional support and therapeutic listening provided.  Encouraged patient's family to call PMT with questions or concerns. PMT will continue to support.  1:00 pm Received message that daughter Burnard Gentry would like a call back. Answered questions around symptom management and who they would reach out to for symptom management overnight. We discussed nursing staff as first point of contact and covering attending provider if there were concerns or orders needed to be added. Discussed that PMT is a consulting service and are not available overnight. PMT will plan to follow up in the morning.  Encouraged family to call PMT with questions or concerns.  Recommendations/Plan: Full comfort measures Scheduled morphine  2 mg every three hours  Anticipated hospital death Continued PMT support   Code Status:    Code Status Orders  (From admission, onward)           Start     Ordered   12/11/23 1315  Do not attempt resuscitation (DNR) - Comfort care  Continuous       Question Answer Comment  If patient has no pulse and is not breathing Do Not Attempt Resuscitation   In Pre-Arrest Conditions (Patient Is Breathing and Has a Pulse) Provide comfort measures. Relieve any mechanical airway obstruction. Avoid transfer unless required for comfort.   Consent: Discussion documented in EHR or advanced directives reviewed      12/11/23 1315         Extensive chart review has been completed prior to seeing the patient including progress/consult notes, orders, medications, and available advance directive documents.  Prognosis:  Hours - Days  Discharge Planning: Anticipated Hospital Death  Care plan was discussed with bedside RN and Dr. Christobal  Time spent: 80 minutes  Thank you for allowing the Palliative Medicine Team to assist in  the care of this patient.    Stephane CHRISTELLA Palin, NP  Please contact Palliative Medicine Team phone at [phone] for questions and concerns.

## 2023-12-15 DIAGNOSIS — R001 Bradycardia, unspecified: Secondary | ICD-10-CM | POA: Diagnosis not present

## 2023-12-15 MED ORDER — ORAL CARE MOUTH RINSE
15.0000 mL | OROMUCOSAL | Status: DC | PRN
Start: 2023-12-15 — End: 2023-12-17

## 2023-12-15 MED ORDER — ORAL CARE MOUTH RINSE
15.0000 mL | OROMUCOSAL | Status: DC
  Administered 2023-12-15 – 2023-12-16 (×7): 15 mL via OROMUCOSAL

## 2023-12-15 NOTE — Plan of Care (Signed)
  Problem: Coping: Goal: Ability to identify and develop effective coping behavior will improve Outcome: Not Applicable

## 2023-12-15 NOTE — Progress Notes (Signed)
 PROGRESS NOTE Manuel Vargas  FMW:[id number] DOB: [birth year] DOA: 12/09/2023 PCP: Amon Aloysius BRAVO, MD  Brief Narrative/Hospital Course: 32 yoM with long hx but significant for hx of dementia and progressive decline/ cachexia admitted after syncopal episode, possible GIB.  He was found to have malfunctioning pacemaker- battery at end of life, was in junctional 30's but then developed torsades/ vfib requiring emergent TVP on 7/21. Given pt's wishes>DNR/ DNI, no more procedures> he was transitioned to full comfort 7/23. remains now junctional in 30's, mainly unresponsive on prn comfort meds. Re-engaged PMT per family request to help manage.  Subjective: Seen and examined Wife daughter at the bedside Patient appears to be more restful and comfortable today after rtarting scheduled morphine  yesterday Unresponsive. Low-grade temperature, BP soft and hypoxic on am vital  Assessment and plan:  End-of-life care Symptomatic bradycardia/Presumed malfunction of pacemaker at battery end of life Junctional rhythm and torsades. S/p TVP placed by cardiology on 7/21>, TVP removed 7/24 am Cardiogenic syncope S/P pacemaker History of A-fib Hematemsis/GI bleed Anemia  Dementia/delirium Diabetes type 2 PAD CAD AAA Hyperlipidemia Hepatic steatosis  Cachexia/ Severe malnutrition/ debility: Plan: Continue current end-of-life measures as per palliative care we are following closely.  On morphine  2 mg every 3 hours along with as needed morphine  Robinul  Versed  and other comforting meds Continue supportive care oral care, chaplain care for patient and family Plan of care discussed with the patient family at the bedside   Body mass index is 18.25 kg/m.:  DVT prophylaxis: SCDs Start: 12/09/23 1826 Code Status:   Code Status: Do not attempt resuscitation (DNR) - Comfort care Family Communication: plan of care discussed with patient/family at bedside. Patient status is: Remains hospitalized because of severity of  illness Level of care: Palliative Care   Dispo: The patient is from: home            Anticipated disposition: Anticipated hospital death Objective: Vitals last 24 hrs: Vitals:   12/14/23 0743 12/14/23 1628 12/14/23 2233 12/15/23 0805  BP: (!) 145/56 (!) 140/47 (!) 108/39 (!) 114/39  Pulse: (!) 44 (!) 44 (!) 43 (!) 44  Resp: 18 20 20    Temp: 98 F (36.7 C) 98.2 F (36.8 C) 98.9 F (37.2 C) (!) 100.6 F (38.1 C)  TempSrc:  Oral Oral Oral  SpO2: (!) 84% (!) 76% (!) 63%   Weight:      Height:       Physical Examination: Unresponsive Breathing spontaneously Basal crackles Cool extremities full exam deferred for comfort  Data Reviewed: I have personally reviewed following labs and imaging studies ( see epic result tab) CBC: Recent Labs  Lab 12/09/23 1512 12/09/23 1949 12/10/23 0818  WBC 5.0 5.8 15.1*  NEUTROABS 4.0  --   --   HGB 8.0* 8.6* 7.8*  HCT 25.5* 28.2* 24.9*  MCV 93.8 94.0 92.6  PLT 256 267 236   CMP: Recent Labs  Lab 12/09/23 1512 12/09/23 1949 12/09/23 1952 12/10/23 0818  NA 141 143  --  139  K 3.7 4.1  --  3.9  CL 110 108  --  106  CO2 20* 18*  --  16*  GLUCOSE 121* 115*  --  111*  BUN 18 17  --  21  CREATININE 1.28* 1.21  --  1.36*  CALCIUM 9.5 9.7  --  9.0  MG 1.9 2.2  --  1.8  PHOS  --   --  2.4*  --    GFR: Estimated Creatinine Clearance: 33.6 mL/min (A) (by  C-G formula based on SCr of 1.36 mg/dL (H)). Recent Labs  Lab 12/09/23 1512 12/09/23 1949  AST 106* 228*  ALT 53* 115*  ALKPHOS 70 80  BILITOT 2.3* 2.0*  PROT 7.1 7.2  ALBUMIN 2.9* 3.0*   No results for input(s): LIPASE, AMYLASE in the last 168 hours. No results for input(s): AMMONIA in the last 168 hours. Coagulation Profile:  Recent Labs  Lab 12/09/23 1512  INR 1.4*   Unresulted Labs (From admission, onward)    None      Antimicrobials/Microbiology: Anti-infectives (From admission, onward)    None      No results found for: SDES, SPECREQUEST, CULT,  REPTSTATUS  Procedures: Procedure(s) (LRB): TEMPORARY PACEMAKER (N/A) Medications reviewed:  Scheduled Meds:  Chlorhexidine  Gluconate Cloth  6 each Topical Daily    morphine  injection  2 mg Intravenous Q3H   mouth rinse  15 mL Mouth Rinse 4 times per day   sodium chloride  flush  3 mL Intravenous Q12H   Continuous Infusions:  Mennie LAMY, MD [facility] 12/15/2023, 10:41 AM

## 2023-12-15 NOTE — Plan of Care (Signed)
  Problem: Coping: Goal: Ability to identify and develop effective coping behavior will improve Outcome: Progressing   

## 2023-12-15 NOTE — Progress Notes (Addendum)
 Daily Progress Note   Patient Name: Manuel Vargas       Date: 12/15/2023 DOB: 1940-09-26  Age: 83 y.o. MRN#: [id number] Attending Physician: Christobal Guadalajara, MD Primary Care Physician: Amon Aloysius BRAVO, MD Admit Date: 12/09/2023  Reason for Consultation/Follow-up: Establishing goals of care   Length of Stay: 6  Current Medications: Scheduled Meds:   Chlorhexidine  Gluconate Cloth  6 each Topical Daily    morphine  injection  2 mg Intravenous Q3H   sodium chloride  flush  3 mL Intravenous Q12H    Continuous Infusions:   PRN Meds: acetaminophen  **OR** acetaminophen , artificial tears, glycopyrrolate , midazolam , morphine  injection, ondansetron  (ZOFRAN ) IV  Physical Exam Constitutional:      General: He is not in acute distress.    Appearance: He is ill-appearing.  Pulmonary:     Comments: unlabored Skin:    General: Skin is warm and dry.     Findings: Bruising present.             Vital Signs: BP (!) 114/39   Pulse (!) 44   Temp (!) 100.6 F (38.1 C) (Oral)   Resp 20   Ht 5' 10 (1.778 m)   Wt 57.7 kg   SpO2 (!) 63%   BMI 18.25 kg/m  SpO2: SpO2: (!) 63 % O2 Device: O2 Device: Room Air O2 Flow Rate: O2 Flow Rate (L/min): 6 L/min   Palliative Assessment/Data: 10%      Patient Active Problem List   Diagnosis Date Noted   Palliative care by specialist 12/11/2023   Hematemesis 12/09/2023   Syncope 12/09/2023   Bradycardia 12/09/2023   Atrial fibrillation (HCC) 08/22/2023   GERD (gastroesophageal reflux disease) 08/22/2023   Post PTCA 08/22/2023   AAA (abdominal aortic aneurysm) (HCC) 08/22/2023   Angina pectoris (HCC) 08/22/2023   Type 2 diabetes mellitus (HCC) 08/22/2023   Diverticulosis 08/22/2023   Hepatic steatosis 08/22/2023   Dyspnea 08/22/2023   Generalized  edema 08/22/2023   Goals of care, counseling/discussion 07/27/2023   VSD (ventricular septal defect) 07/26/2023   Dementia (HCC) 07/26/2023   PVD (peripheral vascular disease) (HCC) 07/26/2023   Coronary atherosclerosis 04/09/2022   Deficiency of other specified B group vitamins 04/09/2022   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 On PPM interrogation pacemaker not functioning appropriately. Patient's EKG noted to be in junctional rhythm-heart rate 30s to 35. Upon arrival to CCU, patient having bradycardic events with torsades. Patient had TVP placed for bradycardia/torsades. Family made decision to transition to comfort care 7/23. PMT consulted to support patient and family.   Today's Discussion: Reviewed chart. Patient full comfort care.  He remains on scheduled morphine  every three hours. Patient did not require any prns overnight. Patient is sleeping in no apparent distress. His breathing is unlabored. His wife is asleep at bedside and does not wake up.  8:30 am: Called patient's daughter Manuel Vargas to give her an update.  She shared that yesterday afternoon and evening the scheduled morphine  seemed to relieve her dad's symptoms.  Discussed the plan to continue with scheduled morphine .  Discussed that if the patient's symptoms were to worsen again we could consider a continuous morphine  infusion.  Patient's daughter is at bedside now and notes the patient had a temperature.  Manuel Vargas asked if we should administer Tylenol  for this.  At this time the patient appears comfortable so we will not administer Tylenol  however  if the patient begins to look uncomfortable this medication is available.  Daughters are grateful for PMT support.  Emotional support and therapeutic listening provided.  Encouraged family to call PMT with questions or concerns.  PMT will continue to follow.  4:30 pm: Returned call from patient's daughter Manuel Vargas around patient's care. Service recovery performed.   Recommendations/Plan: Full comfort measures Continue scheduled morphine  Anticipated hospital death Continued PMT support   Code Status:    Code Status Orders  (From admission, onward)           Start     Ordered   12/11/23 1315  Do not attempt resuscitation (DNR) - Comfort care  Continuous       Question Answer Comment  If patient has no pulse and is not breathing Do Not Attempt Resuscitation   In Pre-Arrest Conditions (Patient Is Breathing and Has a Pulse) Provide comfort measures. Relieve any mechanical airway obstruction. Avoid transfer unless required for comfort.   Consent: Discussion documented in EHR or advanced directives reviewed      12/11/23 1315         Extensive chart review has been completed prior to seeing the patient including progress/consult notes, orders, medications, and available advance directive documents.  Prognosis:  Hours - Days  Discharge Planning: Anticipated Hospital Death  Care plan was discussed with bedside RN  Time spent: 35 minutes  Thank you for allowing the Palliative Medicine Team to assist in the care of this patient.    Stephane CHRISTELLA Palin, NP  Please contact Palliative Medicine Team phone at [phone] for questions and concerns.

## 2023-12-16 DIAGNOSIS — R001 Bradycardia, unspecified: Secondary | ICD-10-CM | POA: Diagnosis not present

## 2023-12-16 MED ORDER — GLYCOPYRROLATE 0.2 MG/ML IJ SOLN
0.2000 mg | Freq: Two times a day (BID) | INTRAMUSCULAR | Status: DC
  Administered 2023-12-16 (×2): 0.2 mg via INTRAVENOUS
  Filled 2023-12-16 (×2): qty 1

## 2023-12-16 MED ORDER — GLYCOPYRROLATE 0.2 MG/ML IJ SOLN: 0.4000 mg | INTRAMUSCULAR | Status: DC | PRN

## 2023-12-16 NOTE — Progress Notes (Signed)
 PROGRESS NOTE Manuel Vargas  FMW:[id number] DOB: 07-Jun-1940 DOA: 12/09/2023 PCP: Amon Aloysius BRAVO, MD  Brief Narrative/Hospital Course: 50 yoM with long hx but significant for hx of dementia and progressive decline/ cachexia admitted after syncopal episode, possible GIB.  He was found to have malfunctioning pacemaker- battery at end of life, was in junctional 30's but then developed torsades/ vfib requiring emergent TVP on 7/21. Given pt's wishes>DNR/ DNI, no more procedures> he was transitioned to full comfort 7/23. remains now junctional in 30's, mainly unresponsive on prn comfort meds. Re-engaged PMT per family request to help manage.  Subjective: Patient seen and examined Remains comfortable wife is sleeping at the bedside Patient remains on morphine  IV along with as needed comforting meds   Assessment and plan:  End-of-life care Symptomatic bradycardia/Presumed malfunction of pacemaker at battery end of life Junctional rhythm and torsades. S/p TVP placed by cardiology on 7/21>, TVP removed 7/24 am Cardiogenic syncope S/P pacemaker History of A-fib Hematemsis/GI bleed Anemia  Dementia/delirium Diabetes type 2 PAD CAD AAA Hyperlipidemia Hepatic steatosis  Cachexia/ Severe malnutrition/ debility: Plan: Continue current end-of-life measures as per palliative care team who are following closely. Patient appears much comfortable after being on morphine  2 mg q3 hr and prns morphine  Robinul  Versed   Continue supportive care oral care, chaplain care for patient and family   DVT prophylaxis: SCDs Start: 12/09/23 1826 Code Status:   Code Status: Do not attempt resuscitation (DNR) - Comfort care Family Communication: plan of care discussed with patient/family at bedside. Patient status is: Remains hospitalized because of severity of illness Level of care: Palliative Care   Dispo: The patient is from: home            Anticipated disposition: Anticipated hospital death Objective: Vitals last  24 hrs: Vitals:   12/14/23 2233 12/15/23 0805 12/15/23 1957 12/16/23 0550  BP: (!) 108/39 (!) 114/39 (!) 106/51 (!) 104/42  Pulse: (!) 43 (!) 44 (!) 43 (!) 43  Resp: 20   20  Temp: 98.9 F (37.2 C) (!) 100.6 F (38.1 C) 98.8 F (37.1 C) 99.5 F (37.5 C)  TempSrc: Oral Oral Oral Oral  SpO2: (!) 63%  (!) 73% (!) 72%  Weight:      Height:       Physical Examination: Unresponsive Close comfortable full exam deferred for comfort Breathing spontaneously  Data Reviewed: I have personally reviewed following labs and imaging studies ( see epic result tab) CBC: Recent Labs  Lab 12/09/23 1512 12/09/23 1949 12/10/23 0818  WBC 5.0 5.8 15.1*  NEUTROABS 4.0  --   --   HGB 8.0* 8.6* 7.8*  HCT 25.5* 28.2* 24.9*  MCV 93.8 94.0 92.6  PLT 256 267 236   CMP: Recent Labs  Lab 12/09/23 1512 12/09/23 1949 12/09/23 1952 12/10/23 0818  NA 141 143  --  139  K 3.7 4.1  --  3.9  CL 110 108  --  106  CO2 20* 18*  --  16*  GLUCOSE 121* 115*  --  111*  BUN 18 17  --  21  CREATININE 1.28* 1.21  --  1.36*  CALCIUM 9.5 9.7  --  9.0  MG 1.9 2.2  --  1.8  PHOS  --   --  2.4*  --    GFR: Estimated Creatinine Clearance: 33.6 mL/min (A) (by C-G formula based on SCr of 1.36 mg/dL (H)). Recent Labs  Lab 12/09/23 1512 12/09/23 1949  AST 106* 228*  ALT 53* 115*  ALKPHOS 70  80  BILITOT 2.3* 2.0*  PROT 7.1 7.2  ALBUMIN 2.9* 3.0*   No results for input(s): LIPASE, AMYLASE in the last 168 hours. No results for input(s): AMMONIA in the last 168 hours. Coagulation Profile:  Recent Labs  Lab 12/09/23 1512  INR 1.4*   Unresulted Labs (From admission, onward)    None      Antimicrobials/Microbiology: Anti-infectives (From admission, onward)    None      No results found for: SDES, SPECREQUEST, CULT, REPTSTATUS  Procedures: Procedure(s) (LRB): TEMPORARY PACEMAKER (N/A) Medications reviewed:  Scheduled Meds:  Chlorhexidine  Gluconate Cloth  6 each Topical Daily     morphine  injection  2 mg Intravenous Q3H   mouth rinse  15 mL Mouth Rinse 4 times per day   sodium chloride  flush  3 mL Intravenous Q12H   Continuous Infusions:  Mennie LAMY, MD [facility] 12/16/2023, 8:47 AM

## 2023-12-16 NOTE — Plan of Care (Signed)
   Problem: Coping: Goal: Level of anxiety will decrease Outcome: Completed/Met

## 2023-12-16 NOTE — Progress Notes (Signed)
 Palliative:  HPI: 83 y.o. 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 On PPM interrogation pacemaker not functioning appropriately. Patient's EKG noted to be in junctional rhythm-heart rate 30s to 35. Upon arrival to CCU, patient having bradycardic events with torsades. Patient had TVP placed for bradycardia/torsades. Family made decision to transition to comfort care 7/23. PMT consulted to support patient and family.   I met today at Manuel Vargas's bedside along with wife and his 2 daughter, Manuel Vargas and Manuel Vargas. We were joined later by wife's daughter as well. I discussed with family Manuel Vargas's current status and slow progression at end of life. He appears very comfortable on current regimen and no changes needed. He has had some slight congestion so Robinul  scheduled BID with PRN doses also available. We discussed shallow breathing which is newer today - prognosis likely hours to 2 days most likely.   Wife spent time discussing how wonderful their life has been together and what a wonderful marriage they have had. She does not want to leave his side. We did discuss self care and that sometimes patient's what until their loved ones leave their bedside before they feel they can go. Wife understands but has no desire to leave bedside. There is concern for active UTI and her wellbeing - discussed with family and nursing staff how to best support wife and keep her safe and health as well.   All questions/concerns addressed. Emotional support provided.   Exam: Unresponsive. Breathing regular but shallow. Abd soft. No distress. HR 40s. Nail bed cyanotic. Warm to touch - feet cooler to touch but this is likely chronic PAD.   Plan: - DNR - Full comfort care -  Medications adjusted to ensure comfort - Anticipate hospital death  65 min  Bernarda Kitty, NP Palliative Medicine Team Pager [phone] (Please see [email or web address] for schedule) Team Phone [phone]

## 2023-12-20 NOTE — Death Summary Note (Signed)
 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  He was found to have malfunctioning pacemaker- battery at end of life, was in junctional 30's but then developed torsades/ vfib requiring emergent TVP on Dec 10, 2023. Given pt's wishes>DNR/ DNI, no more procedures> he was transitioned to full comfort 7/23. remains now junctional in 30's, mainly unresponsive on prn comfort meds. Re-engaged PMT per family request to help manage. 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 S/p TVP placed by cardiology on 12/10/23>, TVP removed 7/24 am Paroxysmal A-fib Hematemsis/GI bleed Anemia  Dementia/delirium/agitation sundowning Diabetes type 2 PAD CAD AAA Hyperlipidemia Hepatic steatosis  Cachexia/ Severe malnutrition/ debility:  Procedures:  Procedure(s) (LRB): TEMPORARY PACEMAKER (N/A)   Consultations: Pulmonary critical  care Electrophysiology Cardiology Gastroenterology Palliative care  The results of significant diagnostics from this hospitalization (including imaging, microbiology, ancillary and laboratory) are listed below for reference.   Significant Diagnostic Studies: ECHOCARDIOGRAM COMPLETE Result Date: 12/10/2023    ECHOCARDIOGRAM REPORT   Patient Name:   Manuel Vargas Date of Exam: 12/10/2023 Medical Rec #:  [id number]    Height:       70.0 in Accession #:    [id number]   Weight:       127.2 lb Date of Birth:  11-14-1940     BSA:          1.722 m Patient Age:    83 years     BP:           95/67 mmHg Patient Gender: M            HR:           80 bpm. Exam Location:  Inpatient Procedure: 2D Echo, Cardiac Doppler and Color Doppler (Both Spectral and Color            Flow Doppler were utilized during procedure). Indications:    Syncope  History:        Patient has no prior history of Echocardiogram examinations.                 Signs/Symptoms:Syncope.  Sonographer:    Vella Key Referring Phys: MARSA NOVAK MELVIN IMPRESSIONS  1. Left ventricular ejection fraction, by estimation, is 40 to 45%. The left ventricle has mildly decreased function. The left ventricle demonstrates regional wall motion abnormalities (see scoring diagram/findings for description). Left ventricular diastolic parameters are indeterminate. Abnormal septal motion.  2. Right ventricular systolic function is normal. The right ventricular size is mildly enlarged.  3. Left  atrial size was severely dilated.  4. Right atrial size was moderately dilated.  5. The mitral valve is grossly normal. Mild mitral valve regurgitation. Severe mitral annular calcification.  6. Tricuspid valve regurgitation is mild to moderate.  7. No significant aortic gradient noted on this study. The aortic valve is calcified. Aortic valve regurgitation is not visualized.  8. Aortic dilatation noted. There is mild dilatation of the aortic root, measuring 41 mm.  9. The inferior vena  cava is dilated in size with <50% respiratory variability, suggesting right atrial pressure of 15 mmHg. Comparison(s): No prior Echocardiogram. Conclusion(s)/Recommendation(s): Consider Pedoff Aortic Valve assessment on future non-supine evaluations. FINDINGS  Left Ventricle: Left ventricular ejection fraction, by estimation, is 40 to 45%. The left ventricle has mildly decreased function. The left ventricle demonstrates regional wall motion abnormalities. The left ventricular internal cavity size was normal in size. There is no left ventricular hypertrophy. Left ventricular diastolic parameters are indeterminate.  LV Wall Scoring: The anterior septum, mid inferoseptal segment, and basal inferoseptal segment are dyskinetic. The entire anterior wall, entire lateral wall, entire inferior wall, and apical septal segment are normal. Abnormal septal motion. Right Ventricle: The right ventricular size is mildly enlarged. No increase in right ventricular wall thickness. Right ventricular systolic function is normal. Left Atrium: Left atrial size was severely dilated. Right Atrium: Right atrial size was moderately dilated. Pericardium: There is no evidence of pericardial effusion. Mitral Valve: The mitral valve is grossly normal. Severe mitral annular calcification. Mild mitral valve regurgitation. MV peak gradient, 2.0 mmHg. The mean mitral valve gradient is 1.0 mmHg. Tricuspid Valve: The tricuspid valve is normal in structure. Tricuspid valve regurgitation is mild to moderate. Aortic Valve: No significant aortic gradient noted on this study. The aortic valve is calcified. Aortic valve regurgitation is not visualized. Pulmonic Valve: The pulmonic valve was normal in structure. Pulmonic valve regurgitation is trivial. No evidence of pulmonic stenosis. Aorta: Aortic dilatation noted. There is mild dilatation of the aortic root, measuring 41 mm. Venous: The inferior vena cava is dilated in size with less than 50% respiratory  variability, suggesting right atrial pressure of 15 mmHg. IAS/Shunts: The atrial septum is grossly normal. Additional Comments: A device lead is visualized in the right ventricle and right atrium.  LEFT VENTRICLE PLAX 2D LVIDd:         4.00 cm     Diastology LVIDs:         3.10 cm     LV e' medial:    9.14 cm/s LV PW:         1.10 cm     LV E/e' medial:  6.7 LV IVS:        1.00 cm     LV e' lateral:   9.68 cm/s LVOT diam:     1.95 cm     LV E/e' lateral: 6.3 LV SV:         19 LV SV Index:   11 LVOT Area:     2.99 cm  LV Volumes (MOD) LV vol d, MOD A2C: 79.0 ml LV vol d, MOD A4C: 71.8 ml LV vol s, MOD A2C: 29.5 ml LV vol s, MOD A4C: 54.5 ml LV SV MOD A2C:     49.5 ml LV SV MOD A4C:     71.8 ml LV SV MOD BP:      32.8 ml RIGHT VENTRICLE RV Basal diam:  4.00 cm RV S prime:     7.62 cm/s TAPSE (M-mode): 1.3 cm LEFT  ATRIUM             Index        RIGHT ATRIUM           Index LA diam:        6.70 cm 3.89 cm/m   RA Area:     25.30 cm LA Vol (A2C):   97.8 ml 56.79 ml/m  RA Volume:   78.50 ml  45.58 ml/m LA Vol (A4C):   77.6 ml 45.06 ml/m LA Biplane Vol: 90.6 ml 52.61 ml/m  AORTIC VALVE LVOT Vmax:   44.70 cm/s LVOT Vmean:  30.700 cm/s LVOT VTI:    0.063 m  AORTA Ao Root diam: 4.15 cm Ao Asc diam:  2.80 cm MITRAL VALVE               TRICUSPID VALVE MV Area (PHT): 9.48 cm    TV Peak grad:   22.7 mmHg MV Area VTI:   1.48 cm    TV Vmax:        2.38 m/s MV Peak grad:  2.0 mmHg    TR Peak grad:   22.7 mmHg MV Mean grad:  1.0 mmHg    TR Vmax:        238.00 cm/s MV Vmax:       0.70 m/s MV Vmean:      44.9 cm/s   SHUNTS MV Decel Time: 80 msec     Systemic VTI:  0.06 m MV E velocity: 60.80 cm/s  Systemic Diam: 1.95 cm MV A velocity: 29.30 cm/s MV E/A ratio:  2.08 Stanly Leavens MD Electronically signed by Stanly Leavens MD Signature Date/Time: 12/10/2023/10:34:31 AM    Final    DG Chest Port 1 View Result Date: 12/10/2023 CLINICAL DATA:  Aspiration into airway EXAM: PORTABLE CHEST 1 VIEW COMPARISON:  12/09/2023  FINDINGS: Interval placement of right IJ central venous catheter with tip over the mid/lower SVC. Slightly low lung volumes. Increased left basilar and left midlung opacities. Prominence of the central pulmonary vasculature. Additional patchy opacities in the right lung base. Left pleural effusion is increased. Possible trace right pleural effusion. No pneumothorax. Similar appearance of the cardiomediastinal silhouette. IMPRESSION: Increased left basilar and left midlung opacities concerning for aspiration/infiltrate. Left pleural effusion is slightly increased. Possible trace right pleural effusion. Similar cardiomegaly with increased pulmonary vascular congestion. Electronically Signed   By: Donnice Mania M.D.   On: 12/10/2023 08:45   CARDIAC CATHETERIZATION Result Date: 12/09/2023 Successful placement of temporary transvenous pacemaker.   CT Head Wo Contrast Result Date: 12/09/2023 CLINICAL DATA:  Neck trauma (Age >= 65y); Head trauma, minor (Age >= 65y) EXAM: CT HEAD WITHOUT CONTRAST CT CERVICAL SPINE WITHOUT CONTRAST TECHNIQUE: Multidetector CT imaging of the head and cervical spine was performed following the standard protocol without intravenous contrast. Multiplanar CT image reconstructions of the cervical spine were also generated. RADIATION DOSE REDUCTION: This exam was performed according to the departmental dose-optimization program which includes automated exposure control, adjustment of the mA and/or kV according to patient size and/or use of iterative reconstruction technique. COMPARISON:  None Available. FINDINGS: CT HEAD FINDINGS Brain: Cerebral ventricle sizes are concordant with the degree of cerebral volume loss. Patchy and confluent areas of decreased attenuation are noted throughout the deep and periventricular white matter of the cerebral hemispheres bilaterally, compatible with chronic microvascular ischemic disease. No evidence of large-territorial acute infarction. No parenchymal  hemorrhage. No mass lesion. No extra-axial collection. No mass effect or midline shift. No hydrocephalus. Basilar cisterns  are patent. Vascular: No hyperdense vessel. Atherosclerotic calcifications are present within the cavernous internal carotid arteries. Skull: No acute fracture or focal lesion. Sinuses/Orbits: Paranasal sinuses and mastoid air cells are clear. The orbits are unremarkable. Other: None. CT CERVICAL SPINE FINDINGS Alignment: Normal. Skull base and vertebrae: Multilevel severe degenerative changes spine most prominent at the C5-C7 levels. No associated severe osseous neural foraminal or central canal stenosis. No acute fracture. No aggressive appearing focal osseous lesion or focal pathologic process. Soft tissues and spinal canal: No prevertebral fluid or swelling. No visible canal hematoma. Upper chest: Severe centrilobular and paraseptal emphysematous changes. At least trace right pleural effusion. Other: Atherosclerotic plaque. IMPRESSION: 1. No acute intracranial abnormality. 2. No acute displaced fracture or traumatic listhesis of the cervical spine. 3. Aortic Atherosclerosis (ICD10-I70.0) and Emphysema (ICD10-J43.9). Electronically Signed   By: Morgane  Naveau M.D.   On: 12/09/2023 17:57   CT Cervical Spine Wo Contrast Result Date: 12/09/2023 CLINICAL DATA:  Neck trauma (Age >= 65y); Head trauma, minor (Age >= 65y) EXAM: CT HEAD WITHOUT CONTRAST CT CERVICAL SPINE WITHOUT CONTRAST TECHNIQUE: Multidetector CT imaging of the head and cervical spine was performed following the standard protocol without intravenous contrast. Multiplanar CT image reconstructions of the cervical spine were also generated. RADIATION DOSE REDUCTION: This exam was performed according to the departmental dose-optimization program which includes automated exposure control, adjustment of the mA and/or kV according to patient size and/or use of iterative reconstruction technique. COMPARISON:  None Available. FINDINGS:  CT HEAD FINDINGS Brain: Cerebral ventricle sizes are concordant with the degree of cerebral volume loss. Patchy and confluent areas of decreased attenuation are noted throughout the deep and periventricular white matter of the cerebral hemispheres bilaterally, compatible with chronic microvascular ischemic disease. No evidence of large-territorial acute infarction. No parenchymal hemorrhage. No mass lesion. No extra-axial collection. No mass effect or midline shift. No hydrocephalus. Basilar cisterns are patent. Vascular: No hyperdense vessel. Atherosclerotic calcifications are present within the cavernous internal carotid arteries. Skull: No acute fracture or focal lesion. Sinuses/Orbits: Paranasal sinuses and mastoid air cells are clear. The orbits are unremarkable. Other: None. CT CERVICAL SPINE FINDINGS Alignment: Normal. Skull base and vertebrae: Multilevel severe degenerative changes spine most prominent at the C5-C7 levels. No associated severe osseous neural foraminal or central canal stenosis. No acute fracture. No aggressive appearing focal osseous lesion or focal pathologic process. Soft tissues and spinal canal: No prevertebral fluid or swelling. No visible canal hematoma. Upper chest: Severe centrilobular and paraseptal emphysematous changes. At least trace right pleural effusion. Other: Atherosclerotic plaque. IMPRESSION: 1. No acute intracranial abnormality. 2. No acute displaced fracture or traumatic listhesis of the cervical spine. 3. Aortic Atherosclerosis (ICD10-I70.0) and Emphysema (ICD10-J43.9). Electronically Signed   By: Morgane  Naveau M.D.   On: 12/09/2023 17:57   CT Angio Abd/Pel W and/or Wo Contrast Result Date: 12/09/2023 CLINICAL DATA:  Abdominal aortic aneurysm (AAA), follow up EXAM: CTA ABDOMEN AND PELVIS WITHOUT AND WITH CONTRAST TECHNIQUE: Multidetector CT imaging of the abdomen and pelvis was performed using the standard protocol during bolus administration of intravenous  contrast. Multiplanar reconstructed images and MIPs were obtained and reviewed to evaluate the vascular anatomy. RADIATION DOSE REDUCTION: This exam was performed according to the departmental dose-optimization program which includes automated exposure control, adjustment of the mA and/or kV according to patient size and/or use of iterative reconstruction technique. CONTRAST:  75mL OMNIPAQUE  IOHEXOL  350 MG/ML SOLN COMPARISON:  None Available. 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 Mass effect onto the IVC noted. No fistulization with the IVC identified. No dissection identified. The aneurysm terminates 1 cm before the aortic bifurcation. No periaortic fat stranding. No stenosis. Severe atherosclerotic plaque. No draping sign of the aorta along the lumbar spine. Celiac: Moderate atherosclerotic plaque. Patent without evidence of aneurysm, dissection, vasculitis or significant stenosis. SMA: Moderate atherosclerotic plaque. Patent without evidence of aneurysm, dissection, vasculitis or significant stenosis. Renals: Moderate severe atherosclerotic plaque. Both renal arteries are patent without evidence of aneurysm, dissection, vasculitis, fibromuscular dysplasia or significant stenosis. IMA: Patent without evidence of aneurysm, dissection, vasculitis or significant stenosis. Inflow: Moderate severe atherosclerotic plaque. Patent without evidence of aneurysm, dissection, vasculitis or significant stenosis. Proximal Outflow: Severe atherosclerotic plaque. Right common femoral artery stent with evaluation of patency limited due to timing of contrast. Otherwise bilateral common femoral and visualized portions of the superficial and profunda femoral arteries are patent without evidence of aneurysm, dissection, vasculitis or significant stenosis. Veins: Retrograde reflux of contrast into  the inferior vena cava and hepatic veins. Otherwise no obvious venous abnormality within the limitations of this arterial phase study. Review of the MIP images confirms the above findings. NON-VASCULAR Lower chest: Small hiatal hernia. Cardiomegaly. Cardiac leads partially visualized. Mitral annular calcification. Aortic valve leaflet calcification. Coronary artery calcification. Bilateral small pleural effusions. Passive atelectasis of the lower lung zones. Hepatobiliary: No focal liver abnormality. Question calcified gallstones within the gallbladder lumen. Findings suggestive of associated gallbladder wall thickening. No pericholecystic fluid. No biliary dilatation. Pancreas: No focal lesion. Normal pancreatic contour. No surrounding inflammatory changes. No main pancreatic ductal dilatation. Spleen: Normal in size without focal abnormality. Adrenals/Urinary Tract: No adrenal nodule bilaterally. Bilateral kidneys enhance symmetrically. No hydronephrosis. No hydroureter. The urinary bladder is unremarkable. Stomach/Bowel: Stomach is within normal limits. No evidence of bowel wall thickening or dilatation. Appendix appears normal. Lymphatic: No lymphadenopathy. Reproductive: Prostate is unremarkable. Other: No intraperitoneal free fluid. No intraperitoneal free gas. No organized fluid collection. Musculoskeletal: No abdominal wall hernia.  Diffuse subcutaneus soft tissue edema. No suspicious lytic or blastic osseous lesions. No acute displaced fracture. Multilevel degenerative changes of the spine. IMPRESSION: VASCULAR 1. 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 Associated mass effect onto the IVC with no visualization identified. Recommend referral to or continued care with vascular specialist. (Ref.: J Vasc Surg. 2018; 67:2-77 and J Am Coll Radiol 2013;10(10):789-794.) 2. Aortic Atherosclerosis  (ICD10-I70.0) including coronary artery, mitral annular, aortic valve leaflet calcifications-correlate for aortic stenosis. 3. Right common femoral artery stent with evaluation of patency limited due to timing of contrast. NON-VASCULAR 1. Question cholelithiasis. Nonspecific gallbladder wall thickening. Correlate with liver function test and consider right upper quadrant ultrasound for a more sensitive evaluation of the gallbladder 2. Colonic diverticulosis with no acute diverticulitis. 3. Small hiatal hernia. 4. Bilateral small pleural effusions. 5. Cardiomegaly. Electronically Signed   By: Morgane  Naveau M.D.   On: 12/09/2023 17:53   DG Chest Portable 1 View Result Date: 12/09/2023 CLINICAL DATA:  Syncope. EXAM: PORTABLE CHEST 1 VIEW COMPARISON:  Chest radiograph dated 08/04/2022. FINDINGS: There is cardiomegaly with mild vascular congestion. Faint bilateral interstitial densities may represent edema or atypical pneumonia. Small left pleural effusion and left lung base atelectasis or infiltrate. No pneumothorax. Stable cardiomegaly. Median sternotomy wires and CABG vascular clips. Left pectoral pacemaker device. No acute osseous pathology. IMPRESSION:  1. Cardiomegaly with mild vascular congestion. 2. Small left pleural effusion and left lung base atelectasis or infiltrate. Electronically Signed   By: Vanetta Chou M.D.   On: 12/09/2023 15:41    Microbiology: Recent Results (from the past 240 hours)  MRSA Next Gen by PCR, Nasal     Status: None   Collection Time: 12/10/23  8:03 AM   Specimen: Nasal Mucosa; Nasal Swab  Result Value Ref Range Status   MRSA by PCR Next Gen NOT DETECTED NOT DETECTED Final    Comment: (NOTE) The GeneXpert MRSA Assay (FDA approved for NASAL specimens only), is one component of a comprehensive MRSA colonization surveillance program. It is not intended to diagnose MRSA infection nor to guide or monitor treatment for MRSA infections. Test performance is not FDA  approved in patients less than 22 years old. Performed at [facility], [address], [place], [place] [address]    Time spent: 0 minutes  Signed: Mennie LAMY, MD 01-05-24

## 2023-12-20 DEATH — deceased

## 2024-01-08 ENCOUNTER — Ambulatory Visit: Admitting: Internal Medicine
